# Patient Record
Sex: Female | Born: 1935 | Race: White | Hispanic: No | Smoking: Never smoker
Health system: Southern US, Community
[De-identification: ages and names within clinical notes are randomized; demographics above are authoritative.]

## PROBLEM LIST (undated history)

## (undated) DIAGNOSIS — R5383 Other fatigue: Secondary | ICD-10-CM

## (undated) DIAGNOSIS — F419 Anxiety disorder, unspecified: Secondary | ICD-10-CM

## (undated) DIAGNOSIS — M8580 Other specified disorders of bone density and structure, unspecified site: Secondary | ICD-10-CM

## (undated) DIAGNOSIS — I10 Essential (primary) hypertension: Secondary | ICD-10-CM

## (undated) DIAGNOSIS — H269 Unspecified cataract: Secondary | ICD-10-CM

## (undated) DIAGNOSIS — K625 Hemorrhage of anus and rectum: Secondary | ICD-10-CM

## (undated) DIAGNOSIS — I639 Cerebral infarction, unspecified: Secondary | ICD-10-CM

## (undated) DIAGNOSIS — H3320 Serous retinal detachment, unspecified eye: Secondary | ICD-10-CM

## (undated) DIAGNOSIS — T2200XA Burn of unspecified degree of shoulder and upper limb, except wrist and hand, unspecified site, initial encounter: Secondary | ICD-10-CM

## (undated) DIAGNOSIS — F0390 Unspecified dementia without behavioral disturbance: Secondary | ICD-10-CM

## (undated) HISTORY — DX: Burn of unspecified degree of shoulder and upper limb, except wrist and hand, unspecified site, initial encounter: T22.00XA

## (undated) HISTORY — DX: Anxiety disorder, unspecified: F41.9

## (undated) HISTORY — DX: Serous retinal detachment, unspecified eye: H33.20

## (undated) HISTORY — PX: ABDOMINAL HYSTERECTOMY: SHX81

## (undated) HISTORY — PX: OOPHORECTOMY: SHX86

## (undated) HISTORY — DX: Unspecified dementia without behavioral disturbance: F03.90

## (undated) HISTORY — DX: Unspecified cataract: H26.9

## (undated) HISTORY — DX: Essential (primary) hypertension: I10

## (undated) HISTORY — DX: Other specified disorders of bone density and structure, unspecified site: M85.80

## (undated) HISTORY — PX: CHOLECYSTECTOMY: SHX55

---

## 2001-04-07 ENCOUNTER — Encounter (INDEPENDENT_AMBULATORY_CARE_PROVIDER_SITE_OTHER): Payer: Self-pay

## 2001-04-07 ENCOUNTER — Observation Stay (HOSPITAL_COMMUNITY): Admission: RE | Admit: 2001-04-07 | Discharge: 2001-04-08 | Payer: Self-pay | Admitting: General Surgery

## 2003-03-19 ENCOUNTER — Encounter: Payer: Self-pay | Admitting: Ophthalmology

## 2003-03-19 ENCOUNTER — Ambulatory Visit (HOSPITAL_COMMUNITY): Admission: RE | Admit: 2003-03-19 | Discharge: 2003-03-20 | Payer: Self-pay | Admitting: Ophthalmology

## 2004-02-14 ENCOUNTER — Ambulatory Visit (HOSPITAL_COMMUNITY): Admission: RE | Admit: 2004-02-14 | Discharge: 2004-02-14 | Payer: Self-pay | Admitting: Ophthalmology

## 2004-11-07 ENCOUNTER — Ambulatory Visit: Payer: Self-pay | Admitting: Internal Medicine

## 2004-11-13 ENCOUNTER — Ambulatory Visit: Payer: Self-pay | Admitting: Internal Medicine

## 2005-05-08 ENCOUNTER — Ambulatory Visit: Payer: Self-pay | Admitting: Internal Medicine

## 2005-08-10 ENCOUNTER — Ambulatory Visit: Payer: Self-pay | Admitting: Internal Medicine

## 2005-11-06 ENCOUNTER — Ambulatory Visit: Payer: Self-pay | Admitting: Internal Medicine

## 2006-05-08 ENCOUNTER — Ambulatory Visit: Payer: Self-pay | Admitting: Internal Medicine

## 2006-06-06 ENCOUNTER — Ambulatory Visit: Payer: Self-pay | Admitting: Internal Medicine

## 2006-06-24 ENCOUNTER — Ambulatory Visit: Payer: Self-pay | Admitting: Internal Medicine

## 2006-08-08 ENCOUNTER — Ambulatory Visit: Payer: Self-pay | Admitting: Internal Medicine

## 2006-10-21 ENCOUNTER — Ambulatory Visit: Payer: Self-pay | Admitting: Internal Medicine

## 2006-10-21 LAB — CONVERTED CEMR LAB
ALT: 14 units/L (ref 0–40)
BUN: 13 mg/dL (ref 6–23)
Chol/HDL Ratio, serum: 3.2
Cholesterol: 179 mg/dL (ref 0–200)
Creatinine, Ser: 0.9 mg/dL (ref 0.4–1.2)
Hgb A1c MFr Bld: 5.6 % (ref 4.6–6.0)
Potassium: 4.4 meq/L (ref 3.5–5.1)

## 2006-10-22 DIAGNOSIS — I10 Essential (primary) hypertension: Secondary | ICD-10-CM

## 2006-10-22 DIAGNOSIS — F411 Generalized anxiety disorder: Secondary | ICD-10-CM

## 2006-10-22 DIAGNOSIS — H3320 Serous retinal detachment, unspecified eye: Secondary | ICD-10-CM | POA: Insufficient documentation

## 2007-02-19 ENCOUNTER — Ambulatory Visit: Payer: Self-pay | Admitting: Internal Medicine

## 2007-07-21 ENCOUNTER — Ambulatory Visit: Payer: Self-pay | Admitting: Internal Medicine

## 2007-07-21 DIAGNOSIS — M899 Disorder of bone, unspecified: Secondary | ICD-10-CM | POA: Insufficient documentation

## 2007-07-21 DIAGNOSIS — M949 Disorder of cartilage, unspecified: Secondary | ICD-10-CM

## 2007-07-22 LAB — CONVERTED CEMR LAB
GFR calc Af Amer: 89 mL/min
GFR calc non Af Amer: 74 mL/min
Glucose, Bld: 98 mg/dL (ref 70–99)
Potassium: 3.7 meq/L (ref 3.5–5.1)
Sodium: 142 meq/L (ref 135–145)

## 2007-08-14 ENCOUNTER — Encounter: Payer: Self-pay | Admitting: Internal Medicine

## 2007-09-10 ENCOUNTER — Encounter (INDEPENDENT_AMBULATORY_CARE_PROVIDER_SITE_OTHER): Payer: Self-pay | Admitting: *Deleted

## 2007-10-21 ENCOUNTER — Encounter: Payer: Self-pay | Admitting: Internal Medicine

## 2007-11-24 ENCOUNTER — Ambulatory Visit: Payer: Self-pay | Admitting: Internal Medicine

## 2007-11-28 LAB — CONVERTED CEMR LAB
Cholesterol: 168 mg/dL (ref 0–200)
Eosinophils Absolute: 0.3 10*3/uL (ref 0.0–0.6)
Eosinophils Relative: 4.7 % (ref 0.0–5.0)
HCT: 42 % (ref 36.0–46.0)
Hemoglobin: 13.7 g/dL (ref 12.0–15.0)
Lymphocytes Relative: 21.7 % (ref 12.0–46.0)
MCHC: 32.6 g/dL (ref 30.0–36.0)
MCV: 88.1 fL (ref 78.0–100.0)
Monocytes Absolute: 0.5 10*3/uL (ref 0.2–0.7)
Neutro Abs: 3.5 10*3/uL (ref 1.4–7.7)
Neutrophils Relative %: 64.6 % (ref 43.0–77.0)
RBC: 4.77 M/uL (ref 3.87–5.11)
WBC: 5.5 10*3/uL (ref 4.5–10.5)

## 2007-12-08 ENCOUNTER — Telehealth (INDEPENDENT_AMBULATORY_CARE_PROVIDER_SITE_OTHER): Payer: Self-pay | Admitting: *Deleted

## 2008-01-14 HISTORY — PX: CATARACT EXTRACTION: SUR2

## 2008-05-19 ENCOUNTER — Ambulatory Visit: Payer: Self-pay | Admitting: Internal Medicine

## 2008-05-20 ENCOUNTER — Encounter (INDEPENDENT_AMBULATORY_CARE_PROVIDER_SITE_OTHER): Payer: Self-pay | Admitting: *Deleted

## 2008-05-20 LAB — CONVERTED CEMR LAB
BUN: 15 mg/dL (ref 6–23)
Creatinine, Ser: 1 mg/dL (ref 0.4–1.2)
GFR calc Af Amer: 69 mL/min
Glucose, Bld: 107 mg/dL — ABNORMAL HIGH (ref 70–99)
Potassium: 4.7 meq/L (ref 3.5–5.1)

## 2008-08-16 ENCOUNTER — Encounter: Payer: Self-pay | Admitting: Internal Medicine

## 2008-08-23 ENCOUNTER — Telehealth (INDEPENDENT_AMBULATORY_CARE_PROVIDER_SITE_OTHER): Payer: Self-pay | Admitting: *Deleted

## 2008-11-26 ENCOUNTER — Ambulatory Visit: Payer: Self-pay | Admitting: Internal Medicine

## 2009-03-18 ENCOUNTER — Ambulatory Visit: Payer: Self-pay | Admitting: Internal Medicine

## 2009-03-23 ENCOUNTER — Ambulatory Visit: Payer: Self-pay | Admitting: Internal Medicine

## 2009-04-05 ENCOUNTER — Telehealth: Payer: Self-pay | Admitting: Internal Medicine

## 2009-05-31 ENCOUNTER — Ambulatory Visit: Payer: Self-pay | Admitting: Internal Medicine

## 2009-06-03 ENCOUNTER — Encounter (INDEPENDENT_AMBULATORY_CARE_PROVIDER_SITE_OTHER): Payer: Self-pay | Admitting: *Deleted

## 2009-06-03 LAB — CONVERTED CEMR LAB
BUN: 20 mg/dL (ref 6–23)
CO2: 31 meq/L (ref 19–32)
Calcium: 9.5 mg/dL (ref 8.4–10.5)
Chloride: 105 meq/L (ref 96–112)
Cholesterol: 158 mg/dL (ref 0–200)
Creatinine, Ser: 0.9 mg/dL (ref 0.4–1.2)
Eosinophils Absolute: 0.4 10*3/uL (ref 0.0–0.7)
MCHC: 34.8 g/dL (ref 30.0–36.0)
MCV: 87.8 fL (ref 78.0–100.0)
Monocytes Absolute: 0.6 10*3/uL (ref 0.1–1.0)
Neutrophils Relative %: 60.4 % (ref 43.0–77.0)
Platelets: 236 10*3/uL (ref 150.0–400.0)
Triglycerides: 69 mg/dL (ref 0.0–149.0)
WBC: 6.2 10*3/uL (ref 4.5–10.5)

## 2009-08-18 ENCOUNTER — Encounter: Payer: Self-pay | Admitting: Internal Medicine

## 2009-09-27 ENCOUNTER — Ambulatory Visit: Payer: Self-pay | Admitting: Internal Medicine

## 2009-09-30 LAB — CONVERTED CEMR LAB: AST: 27 units/L (ref 0–37)

## 2010-03-31 ENCOUNTER — Telehealth: Payer: Self-pay | Admitting: Internal Medicine

## 2010-03-31 ENCOUNTER — Ambulatory Visit: Payer: Self-pay | Admitting: Internal Medicine

## 2010-04-03 LAB — CONVERTED CEMR LAB
CO2: 30 meq/L (ref 19–32)
Chloride: 106 meq/L (ref 96–112)
Potassium: 5.5 meq/L — ABNORMAL HIGH (ref 3.5–5.1)
Sodium: 142 meq/L (ref 135–145)

## 2010-04-14 ENCOUNTER — Ambulatory Visit: Payer: Self-pay | Admitting: Internal Medicine

## 2010-07-27 ENCOUNTER — Ambulatory Visit: Payer: Self-pay | Admitting: Internal Medicine

## 2010-08-21 ENCOUNTER — Encounter: Payer: Self-pay | Admitting: Internal Medicine

## 2010-09-14 ENCOUNTER — Ambulatory Visit: Payer: Self-pay | Admitting: Internal Medicine

## 2010-09-14 DIAGNOSIS — T22039A Burn of unspecified degree of unspecified upper arm, initial encounter: Secondary | ICD-10-CM | POA: Insufficient documentation

## 2010-09-15 ENCOUNTER — Encounter (HOSPITAL_BASED_OUTPATIENT_CLINIC_OR_DEPARTMENT_OTHER)
Admission: RE | Admit: 2010-09-15 | Discharge: 2010-11-14 | Payer: Self-pay | Source: Home / Self Care | Attending: General Surgery | Admitting: General Surgery

## 2010-09-20 ENCOUNTER — Telehealth: Payer: Self-pay | Admitting: Internal Medicine

## 2010-09-20 ENCOUNTER — Encounter: Payer: Self-pay | Admitting: Internal Medicine

## 2010-09-28 ENCOUNTER — Telehealth: Payer: Self-pay | Admitting: Internal Medicine

## 2010-09-28 ENCOUNTER — Encounter: Payer: Self-pay | Admitting: Internal Medicine

## 2010-09-28 ENCOUNTER — Ambulatory Visit: Payer: Self-pay | Admitting: Internal Medicine

## 2010-09-28 DIAGNOSIS — R634 Abnormal weight loss: Secondary | ICD-10-CM

## 2010-09-28 LAB — CONVERTED CEMR LAB: Vit D, 25-Hydroxy: 44 ng/mL (ref 30–89)

## 2010-09-29 LAB — CONVERTED CEMR LAB
AST: 28 units/L (ref 0–37)
Basophils Relative: 0.3 % (ref 0.0–3.0)
CO2: 30 meq/L (ref 19–32)
Calcium: 9.8 mg/dL (ref 8.4–10.5)
Eosinophils Absolute: 0.1 10*3/uL (ref 0.0–0.7)
Eosinophils Relative: 0.6 % (ref 0.0–5.0)
Hemoglobin: 14.5 g/dL (ref 12.0–15.0)
Lymphocytes Relative: 11.5 % — ABNORMAL LOW (ref 12.0–46.0)
MCHC: 34.3 g/dL (ref 30.0–36.0)
Monocytes Relative: 7.9 % (ref 3.0–12.0)
Neutro Abs: 8.1 10*3/uL — ABNORMAL HIGH (ref 1.4–7.7)
Neutrophils Relative %: 79.7 % — ABNORMAL HIGH (ref 43.0–77.0)
RBC: 4.8 M/uL (ref 3.87–5.11)
Sodium: 139 meq/L (ref 135–145)
TSH: 2.66 microintl units/mL (ref 0.35–5.50)
WBC: 10.2 10*3/uL (ref 4.5–10.5)

## 2010-10-02 ENCOUNTER — Encounter
Admission: RE | Admit: 2010-10-02 | Discharge: 2010-10-02 | Payer: Self-pay | Source: Home / Self Care | Attending: Internal Medicine | Admitting: Internal Medicine

## 2010-10-03 ENCOUNTER — Ambulatory Visit (HOSPITAL_COMMUNITY)
Admission: RE | Admit: 2010-10-03 | Discharge: 2010-10-03 | Payer: Self-pay | Source: Home / Self Care | Attending: Obstetrics and Gynecology | Admitting: Obstetrics and Gynecology

## 2010-10-06 ENCOUNTER — Ambulatory Visit (HOSPITAL_COMMUNITY)
Admission: RE | Admit: 2010-10-06 | Discharge: 2010-10-06 | Payer: Self-pay | Source: Home / Self Care | Attending: Obstetrics and Gynecology | Admitting: Obstetrics and Gynecology

## 2010-10-10 ENCOUNTER — Encounter: Payer: Self-pay | Admitting: Internal Medicine

## 2010-10-10 ENCOUNTER — Telehealth: Payer: Self-pay | Admitting: Internal Medicine

## 2010-10-13 ENCOUNTER — Telehealth: Payer: Self-pay | Admitting: Internal Medicine

## 2010-10-18 ENCOUNTER — Other Ambulatory Visit: Payer: Self-pay | Admitting: Internal Medicine

## 2010-10-18 ENCOUNTER — Ambulatory Visit
Admission: RE | Admit: 2010-10-18 | Discharge: 2010-10-18 | Payer: Self-pay | Source: Home / Self Care | Attending: Internal Medicine | Admitting: Internal Medicine

## 2010-10-18 LAB — POTASSIUM: Potassium: 4.2 mEq/L (ref 3.5–5.1)

## 2010-10-19 ENCOUNTER — Ambulatory Visit: Admit: 2010-10-19 | Payer: Self-pay | Admitting: Internal Medicine

## 2010-10-30 ENCOUNTER — Telehealth: Payer: Self-pay | Admitting: Internal Medicine

## 2010-11-05 ENCOUNTER — Encounter: Payer: Self-pay | Admitting: Obstetrics and Gynecology

## 2010-11-14 NOTE — Assessment & Plan Note (Signed)
Summary: 6 MTH FU/NS/KDC   Vital Signs:  Patient profile:   77 year old female Height:      62 inches Weight:      110 pounds Temp:     97.7 degrees F oral Pulse rate:   68 / minute BP sitting:   100 / 78  (left arm)  Vitals Entered By: Jeremy Johann CMA (March 31, 2010 9:33 AM) CC: 6 month f/u Comments --Fasting REVIEWED MED LIST, PATIENT AGREED DOSE AND INSTRUCTION CORRECT    History of Present Illness: She feels well, she has been very active in the yard and drinking plenty of fluids  Allergies: 1)  * Sulfa (Sulfonamides) Group 2)  * Sulfa (Sulfonamides) Group  Past History:  Past Medical History: Reviewed history from 09/27/2009 and no changes required. Anxiety Hypertension Osteopenia retinal detachment has cataracts  Past Surgical History: Reviewed history from 05/19/2008 and no changes required. Cholecystectomy Hysterectomy Oophorectomy cataract  L eye 4-09  Social History: Reviewed history from 09/27/2009 and no changes required. Married 2 sons 4 G-sons Tobacco-- no ETOH-- rarely  very active , no routine exercise   Review of Systems       she has a history of anxiety, slightly better, alprazolam does help. She is concerned about the health of her husband who needs a lung biopsy No ambulatory BPs Denies chest pain or shortness of breath No nausea, vomiting, diarrhea  Physical Exam  General:  alert, well-developed, and well-nourished.   Lungs:  normal respiratory effort, no intercostal retractions, no accessory muscle use, and normal breath sounds.   Heart:  normal rate, regular rhythm, and no murmur.   Extremities:  no edema Psych:  Oriented X3, memory intact for recent and remote, normally interactive, good eye contact, not anxious appearing, and not depressed appearing.     Impression & Recommendations:  Problem # 1:  HYPERTENSION (ICD-401.9)  BP slightly low today, patient asymptomatic, no change Her updated medication list for this  problem includes:    Spironolactone 25 Mg Tabs (Spironolactone) .Marland Kitchen... Take 1 tablet by mouth once a day    Metoprolol Tartrate 50 Mg Tabs (Metoprolol tartrate) .Marland Kitchen... Take 1and a half  tablet twice daily  BP today: 100/78 Prior BP: 110/80 (09/27/2009)  Labs Reviewed: K+: 4.9 (05/31/2009) Creat: : 0.9 (05/31/2009)   Chol: 158 (05/31/2009)   HDL: 57.50 (05/31/2009)   LDL: 87 (05/31/2009)   TG: 69.0 (05/31/2009)  Orders: Venipuncture (66440) TLB-BMP (Basic Metabolic Panel-BMET) (80048-METABOL)  Problem # 2:  ANXIETY (ICD-300.00) seems well controlled at this point. We'll refill her alprazolam with needed Her updated medication list for this problem includes:    Alprazolam 0.5 Mg Tabs (Alprazolam) .Marland Kitchen... 1/2-1 by mouth tid  Complete Medication List: 1)  Spironolactone 25 Mg Tabs (Spironolactone) .... Take 1 tablet by mouth once a day 2)  Cenestin 0.3 Mg Tabs (Estrogens conj synthetic a) .Marland Kitchen.. 1 by mouth qd 3)  Metoprolol Tartrate 50 Mg Tabs (Metoprolol tartrate) .... Take 1and a half  tablet twice daily 4)  Alendronate Sodium 70 Mg Tabs (Alendronate sodium) .... Per gyn 5)  Alprazolam 0.5 Mg Tabs (Alprazolam) .... 1/2-1 by mouth tid  Patient Instructions: 1)  Please schedule a follow-up appointment in 6 months (yearly , fasting) Prescriptions: SPIRONOLACTONE 25 MG TABS (SPIRONOLACTONE) Take 1 tablet by mouth once a day  #90 x 3   Entered and Authorized by:   Elita Quick E. Eliazar Olivar MD   Signed by:   Nolon Rod. Jordana Dugue MD on 03/31/2010  Method used:   Reprint   RxID:   [id number] SPIRONOLACTONE 25 MG TABS (SPIRONOLACTONE) Take 1 tablet by mouth once a day  #90 x 3   Entered and Authorized by:   Elita Quick E. Netty Sullivant MD   Signed by:   Nolon Rod. Cheney Ewart MD on 03/31/2010   Method used:   Electronically to        [facility]* (retail)       [address]       [place], [place]  [address]       Ph: [phone]       Fax: [phone]   RxID:   [id number]

## 2010-11-14 NOTE — Progress Notes (Signed)
Summary: high potassium  Phone Note Outgoing Call   Call placed by: Jeremy Johann CMA,  March 31, 2010 5:08 PM Details for Reason: advised patient, avoid foods high in potassium   Recheck in 2 weeks Consider change his diuretic, she is on Spironolactone Summary of Call: tried to call pt line busy will try again on monday.........................Marland KitchenFelecia Deloach CMA  March 31, 2010 5:08 PM   Follow-up for Phone Call        spoke with the patient, discussed a low potassium diet. Will mail a list of potassium rich foods. She will also come back in 2 weeks for a BMP Follow-up by: Tonika Eden E. Amadeus Oyama MD,  March 31, 2010 5:09 PM

## 2010-11-14 NOTE — Progress Notes (Signed)
Summary: pt status   ---- Converted from flag ---- ---- 09/15/2010 4:58 PM, Jose E. Paz MD wrote: please check on her ------------------------------  I spoke w/ pt and she states that her arm is still really bad. Pt states she is going back for the next 4 weeks. Army Fossa CMA  September 20, 2010 4:02 PM If wound looks worse, needs to discuss with the wound care center [facility] E. Paz MD  September 21, 2010 12:53 PM   She is seeing them once a week for the next 4 weeks. Army Fossa CMA  September 21, 2010 1:03 PM
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
## 2010-11-14 NOTE — Assessment & Plan Note (Signed)
Summary: FLU SHOT/KB  Nurse Visit  CC: Flu shot./kb   Allergies: 1)  * Sulfa (Sulfonamides) Group 2)  * Sulfa (Sulfonamides) Group  Orders Added: 1)  Flu Vaccine 87yrs + MEDICARE PATIENTS [Q2039] 2)  Administration Flu vaccine - MCR [G0008]           Flu Vaccine Consent Questions     Do you have a history of severe allergic reactions to this vaccine? no    Any prior history of allergic reactions to egg and/or gelatin? no    Do you have a sensitivity to the preservative Thimersol? no    Do you have a past history of Guillan-Barre Syndrome? no    Do you currently have an acute febrile illness? no    Have you ever had a severe reaction to latex? no    Vaccine information given and explained to patient? yes    Are you currently pregnant? no    Lot Number:[id number]   Exp Date:04/14/2011   Site Given  Left Deltoid IMu

## 2010-11-15 ENCOUNTER — Encounter: Payer: Self-pay | Admitting: Internal Medicine

## 2010-11-15 ENCOUNTER — Ambulatory Visit: Admit: 2010-11-15 | Payer: Self-pay | Admitting: Internal Medicine

## 2010-11-15 ENCOUNTER — Ambulatory Visit (INDEPENDENT_AMBULATORY_CARE_PROVIDER_SITE_OTHER): Payer: Medicare Other | Admitting: Internal Medicine

## 2010-11-15 DIAGNOSIS — F411 Generalized anxiety disorder: Secondary | ICD-10-CM

## 2010-11-15 DIAGNOSIS — I10 Essential (primary) hypertension: Secondary | ICD-10-CM

## 2010-11-16 NOTE — Progress Notes (Signed)
Summary: Increased BP  Phone Note Call from Patient   Summary of Call: Patient called the office about her BP being over 140 (147, 149, and as high as 170). Patient was advised to take her Fluoxetine 1 once daily in addition to Metoprolol two times a day and keep diary of readings. Patient was advised to call the office if BP is high and she starts experiencing symptoms. Initial call taken by: Lucious Groves CMA,  October 30, 2010 9:55 AM  Follow-up for Phone Call        Clarification:  Patient was on 1/2 of Fluoxetine. She is just starting the whole tablet today. Lucious Groves CMA  October 30, 2010 3:22 PM   Additional Follow-up for Phone Call Additional follow up Details #1::        add losartan 25mg  1 by mouth once daily nurse visit 2 weeks: BP check and BMP Yanitza Shvartsman E. Justina Bertini MD  October 30, 2010 4:59 PM    Patient notifed and already has appt for 11/15/10. She will keep that appt. Lucious Groves CMA  October 31, 2010 11:05 AM     Additional Follow-up for Phone Call Additional follow up Details #2::    left message on machine to call back to office. Lucious Groves CMA  October 31, 2010 9:49 AM   New/Updated Medications: LOSARTAN POTASSIUM 25 MG TABS (LOSARTAN POTASSIUM) 1 by mouth once daily Prescriptions: LOSARTAN POTASSIUM 25 MG TABS (LOSARTAN POTASSIUM) 1 by mouth once daily  #30 x 1   Entered by:   Lucious Groves CMA   Authorized by:   Nolon Rod. Kace Hartje MD   Signed by:   Lucious Groves CMA on 10/31/2010   Method used:   Electronically to        [id number] (retail)       [address]       [place], [place]  [address]       Ph: [phone] or [phone]       Fax: [phone]   RxID:   [id number]

## 2010-11-16 NOTE — Progress Notes (Signed)
Summary: Elevated BP   Phone Note Call from Patient   Caller: Patient Summary of Call: Pt c/o elevated BP of 181/72 today.  Pt states that BP has been running high since yesterday. Pt does not have reading just states that it was 190 something. Pt denies any headache,nausea, sob, chest pain, dizziness, numbness just elevation in BP. Pls advise................Marland KitchenFelecia Deloach CMA  October 13, 2010 11:13 AM   Follow-up for Phone Call        recommend to check her BP once or twice a day. Patient to call if the blood pressure is consistently more than 145/85. a single elevated blood pressure reading usually does not require medication changes. If she has headache, chest pain, other symptoms then she needs to contact us as well Follow-up by: Macayla Ekdahl E. Wilkes Potvin MD,  October 13, 2010 12:13 PM  Additional Follow-up for Phone Call Additional follow up Details #1::        Discuss with patient .............Marland KitchenFelecia Deloach CMA  October 13, 2010 1:21 PM

## 2010-11-16 NOTE — Progress Notes (Signed)
Summary: xanax refill  Phone Note Refill Request Message from:  Patient on September 28, 2010 10:07 AM  Refills Requested: Medication #1:  ALPRAZOLAM 0.5 MG  TABS 1/2-1 by mouth tid [facility] [address] [facility]    Method Requested: Fax to Local Pharmacy Initial call taken by: Army Fossa CMA,  September 28, 2010 10:07 AM  Follow-up for Phone Call        90, 3 RF Aubri Gathright E. Kylen Ismael MD  September 28, 2010 10:21 AM     Prescriptions: ALPRAZOLAM 0.5 MG  TABS (ALPRAZOLAM) 1/2-1 by mouth tid  #90 x 3   Entered by:   Army Fossa CMA   Authorized by:   Nolon Rod. Anterrio Mccleery MD   Signed by:   Army Fossa CMA on 09/28/2010   Method used:   Printed then faxed to ...       [id number] (retail)       [address]       [place], [place]  [address]       Ph: [phone] or [phone]       Fax: [phone]   RxID:   [id number]

## 2010-11-16 NOTE — Consult Note (Signed)
Summary: [facility]  [facility]   Imported By: Lanelle Bal 10/24/2010 08:33:08  _____________________________________________________________________  External Attachment:    Type:   Image     Comment:   External Document

## 2010-11-16 NOTE — Progress Notes (Signed)
Summary: Test results  Phone Note Call from Patient Call back at Home Phone [phone]   Caller: Patient Summary of Call: patient called to check on her "scan" results from last week--please call her at [phone] Initial call taken by: Jerolyn Shin,  October 10, 2010 10:39 AM  Follow-up for Phone Call        Patient was Cheryl Armstrong notified that her u/s was good. She did note remember speaking with Danielle. Follow-up by: Lucious Groves CMA,  October 10, 2010 11:17 AM

## 2010-11-16 NOTE — Consult Note (Signed)
Summary: [facility]   [facility]   Imported By: Lanelle Bal 10/05/2010 09:43:47  _____________________________________________________________________  External Attachment:    Type:   Image     Comment:   External Document
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
## 2010-11-16 NOTE — Assessment & Plan Note (Signed)
Summary: BLD PRESSURE RUNNING TOO HIGH/RH.....   Vital Signs:  Patient profile:   77 year old female Weight:      106.38 pounds Pulse rate:   80 / minute Pulse rhythm:   regular BP sitting:   158 / 78  (left arm) Cuff size:   regular  Vitals Entered By: Army Fossa CMA (October 18, 2010 11:26 AM) CC: BP has been elevated- not fasting  Comments running around 160/76 [facility] [address]    History of Present Illness: her blood pressure has been slightly elevated lately. We recently discontinued her spironolactone due to a ncreased potassium. In addition, she lost her husband in November 2011, having a hard time coping with that. having to do a lot of paperwork, etc  Very anxious throughout the day. Xanax helps but not completely  Current Medications (verified): 1)  Metoprolol Tartrate 50 Mg  Tabs (Metoprolol Tartrate) .... Take 1and A Half  Tablet Twice Daily 2)  Alprazolam 0.5 Mg  Tabs (Alprazolam) .... 1/2-1 By Mouth Tid 3)  Lidocaine Hcl 2 % Gel (Lidocaine Hcl)  Allergies (verified): 1)  * Sulfa (Sulfonamides) Group 2)  * Sulfa (Sulfonamides) Group  Social History: Married, lost husband 08-2010 lives by herself  2 sons, in [place], live close to her house 4 G-sons Tobacco-- no ETOH-- rarely  very active , no routine exercise   Review of Systems       besides anxiety, she is also tearful and somehow depressed. Denies suicide ideas Sleeps relatively well, approximately 8 hours at night  Physical Exam  General:  alert, well-developed, and well-nourished.   Psych:  Oriented X3, memory intact for recent and remote, normally interactive, good eye contact, not anxious appearing, slt  depressed ?    Impression & Recommendations:  Problem # 1:  HYPERTENSION (ICD-401.9) recently,  spironolactone  was discontinued due to elevated potassium. BP is slightly higher than usual Plan: Recheck potassium Increase metoprolol from 150 mg twice a day to 200  milligrams twice a  day Her updated medication list for this problem includes:    Metoprolol Tartrate 50 Mg Tabs (Metoprolol tartrate) .Marland Kitchen... 2  tablets  twice daily  BP today: 158/78 Prior BP: 138/82 (09/28/2010)  Labs Reviewed: K+: 5.9 (09/28/2010) Creat: : 0.9 (09/28/2010)   Chol: 158 (05/31/2009)   HDL: 57.50 (05/31/2009)   LDL: 87 (05/31/2009)   TG: 69.0 (05/31/2009)  Orders: Venipuncture (69629) TLB-Potassium (K+) (84132-K) Specimen Handling (52841)  Problem # 2:  ANXIETY (ICD-300.00) see history of present illness Needs something in addition to Xanax to help her cope with her husband loss. has never try an SSRI. Trial with Prozac. Her updated medication list for this problem includes:    Alprazolam 0.5 Mg Tabs (Alprazolam) .Marland Kitchen... 1/2-1 by mouth tid    Fluoxetine Hcl 20 Mg Tabs (Fluoxetine hcl) ..... Half tablet daily for 10 days, then take one tablet daily  Complete Medication List: 1)  Metoprolol Tartrate 50 Mg Tabs (Metoprolol tartrate) .... 2  tablets  twice daily 2)  Alprazolam 0.5 Mg Tabs (Alprazolam) .... 1/2-1 by mouth tid 3)  Lidocaine Hcl 2 % Gel (Lidocaine hcl) 4)  Fluoxetine Hcl 20 Mg Tabs (Fluoxetine hcl) .... Half tablet daily for 10 days, then take one tablet daily  Patient Instructions: 1)  start fluoxetine as prescribed. Watch for side effects. 2)  Increase metoprolol to 2 tablets twice a day 3)  Come back in 4 weeks for a checkup Prescriptions: FLUOXETINE HCL 20 MG TABS (FLUOXETINE  HCL) half tablet daily for 10 days, then take one tablet daily  #30 x 1   Entered and Authorized by:   Elita Quick E. Paz MD   Signed by:   Nolon Rod. Paz MD on 10/18/2010   Method used:   Print then Give to Patient   RxID:   [id number]    Orders Added: 1)  Venipuncture [02725] 2)  TLB-Potassium (K+) [84132-K] 3)  Specimen Handling [99000] 4)  Est. Patient Level III [36644]

## 2010-11-17 ENCOUNTER — Encounter (HOSPITAL_BASED_OUTPATIENT_CLINIC_OR_DEPARTMENT_OTHER): Payer: Medicare Other | Attending: General Surgery

## 2010-11-17 DIAGNOSIS — X020XXA Exposure to flames in controlled fire in building or structure, initial encounter: Secondary | ICD-10-CM | POA: Insufficient documentation

## 2010-11-17 DIAGNOSIS — Y93G9 Activity, other involving cooking and grilling: Secondary | ICD-10-CM | POA: Insufficient documentation

## 2010-11-17 DIAGNOSIS — T22219A Burn of second degree of unspecified forearm, initial encounter: Secondary | ICD-10-CM | POA: Insufficient documentation

## 2010-11-17 DIAGNOSIS — Y92009 Unspecified place in unspecified non-institutional (private) residence as the place of occurrence of the external cause: Secondary | ICD-10-CM | POA: Insufficient documentation

## 2010-11-17 DIAGNOSIS — Y998 Other external cause status: Secondary | ICD-10-CM | POA: Insufficient documentation

## 2010-11-22 NOTE — Assessment & Plan Note (Signed)
Summary: 4 week f/u   Vital Signs:  Patient profile:   77 year old female Weight:      102.25 pounds Pulse rate:   70 / minute Pulse rhythm:   regular BP sitting:   124 / 76  (left arm) Cuff size:   regular  Vitals Entered By: Army Fossa CMA (November 15, 2010 8:39 AM) CC: 4 week f/u- not fasting  Comments she states she is doing a little better [facility] [address]     History of Present Illness: f/u from last visit she was prescribed fluoxetine, tolerating it well;  overall feels better  Current Medications (verified): 1)  Metoprolol Tartrate 50 Mg  Tabs (Metoprolol Tartrate) .... 2  Tablets  Twice Daily 2)  Alprazolam 0.5 Mg  Tabs (Alprazolam) .... 1/2-1 By Mouth Tid 3)  Fluoxetine Hcl 20 Mg Tabs (Fluoxetine Hcl) .... Half Tablet Daily For 10 Days, Then Take One Tablet Daily 4)  Losartan Potassium 25 Mg Tabs (Losartan Potassium) .Marland Kitchen.. 1 By Mouth Once Daily 5)  Doxycycline Hyclate 100 Mg Tabs (Doxycycline Hyclate) .Marland Kitchen.. 1 By Mouth Two Times A Day- Per Wound Clinic  Allergies (verified): 1)  * Sulfa (Sulfonamides) Group 2)  * Sulfa (Sulfonamides) Group  Past History:  Past Medical History: Reviewed history from 09/28/2010 and no changes required. Anxiety Hypertension Osteopenia retinal detachment has cataracts  Past Surgical History: Reviewed history from 05/19/2008 and no changes required. Cholecystectomy Hysterectomy Oophorectomy cataract  L eye 4-09  Social History: Reviewed history from 10/18/2010 and no changes required. Married, lost husband 08-2010 lives by herself  2 sons, in [place], live close to her house 4 G-sons Tobacco-- no ETOH-- rarely  very active , no routine exercise   Review of Systems       no nausea, vomiting, diarrhea Sleeps better less  anxious   Physical Exam  General:  alert, well-developed, and well-nourished.   Psych:  Oriented X3, memory intact for recent and remote, normally interactive, good eye contact, not anxious  appearing, and not depressed appearing.     Impression & Recommendations:  Problem # 1:  ANXIETY (ICD-300.00) started fluoxetine 10-18-09 4 increase anxiety Tolerating well Good response I continue counseling her today Her updated medication list for this problem includes:    Alprazolam 0.5 Mg Tabs (Alprazolam) .Marland Kitchen... 1/2-1 by mouth tid    Fluoxetine Hcl 20 Mg Tabs (Fluoxetine hcl) ..... One tablet daily     Problem # 2:  .face-to-face more than 15 minutes, more than 50% of the time counseling  Problem # 3:  HYPERTENSION (ICD-401.9) needs refills. BP well controlled Her updated medication list for this problem includes:    Metoprolol Tartrate 50 Mg Tabs (Metoprolol tartrate) .Marland Kitchen... 2  tablets  twice daily    Losartan Potassium 25 Mg Tabs (Losartan potassium) .Marland Kitchen... 1 by mouth once daily  BP today: 124/76 Prior BP: 158/78 (10/18/2010)  Labs Reviewed: K+: 4.2 (10/18/2010) Creat: : 0.9 (09/28/2010)   Chol: 158 (05/31/2009)   HDL: 57.50 (05/31/2009)   LDL: 87 (05/31/2009)   TG: 69.0 (05/31/2009)  Complete Medication List: 1)  Metoprolol Tartrate 50 Mg Tabs (Metoprolol tartrate) .... 2  tablets  twice daily 2)  Alprazolam 0.5 Mg Tabs (Alprazolam) .... 1/2-1 by mouth tid 3)  Fluoxetine Hcl 20 Mg Tabs (Fluoxetine hcl) .... One tablet daily 4)  Losartan Potassium 25 Mg Tabs (Losartan potassium) .Marland Kitchen.. 1 by mouth once daily 5)  Doxycycline Hyclate 100 Mg Tabs (Doxycycline hyclate) .Marland Kitchen.. 1 by mouth two  times a day- per wound clinic  Patient Instructions: 1)  Please schedule a follow-up appointment in 4 months .  Prescriptions: LOSARTAN POTASSIUM 25 MG TABS (LOSARTAN POTASSIUM) 1 by mouth once daily  #90 x 3   Entered and Authorized by:   Elita Quick E. Raylee Strehl MD   Signed by:   Nolon Rod. Jayson Waterhouse MD on 11/15/2010   Method used:   Print then Give to Patient   RxID:   [id number] FLUOXETINE HCL 20 MG TABS (FLUOXETINE HCL) one tablet daily  #90 x 3   Entered and Authorized by:   Nolon Rod. Tc Kapusta MD   Signed  by:   Nolon Rod. Abem Shaddix MD on 11/15/2010   Method used:   Print then Give to Patient   RxID:   [id number]    Orders Added: 1)  Est. Patient Level III [34742]

## 2010-12-15 ENCOUNTER — Encounter (HOSPITAL_BASED_OUTPATIENT_CLINIC_OR_DEPARTMENT_OTHER): Payer: Medicare Other | Attending: General Surgery

## 2010-12-15 DIAGNOSIS — Y998 Other external cause status: Secondary | ICD-10-CM | POA: Insufficient documentation

## 2010-12-15 DIAGNOSIS — T22219A Burn of second degree of unspecified forearm, initial encounter: Secondary | ICD-10-CM | POA: Insufficient documentation

## 2010-12-15 DIAGNOSIS — Y93G9 Activity, other involving cooking and grilling: Secondary | ICD-10-CM | POA: Insufficient documentation

## 2010-12-15 DIAGNOSIS — Y92009 Unspecified place in unspecified non-institutional (private) residence as the place of occurrence of the external cause: Secondary | ICD-10-CM | POA: Insufficient documentation

## 2010-12-15 DIAGNOSIS — X020XXA Exposure to flames in controlled fire in building or structure, initial encounter: Secondary | ICD-10-CM | POA: Insufficient documentation

## 2010-12-19 ENCOUNTER — Encounter: Payer: Self-pay | Admitting: Internal Medicine

## 2011-01-26 ENCOUNTER — Encounter (HOSPITAL_BASED_OUTPATIENT_CLINIC_OR_DEPARTMENT_OTHER): Payer: Medicare Other | Attending: General Surgery

## 2011-01-26 DIAGNOSIS — T22219A Burn of second degree of unspecified forearm, initial encounter: Secondary | ICD-10-CM | POA: Insufficient documentation

## 2011-01-26 DIAGNOSIS — Y92009 Unspecified place in unspecified non-institutional (private) residence as the place of occurrence of the external cause: Secondary | ICD-10-CM | POA: Insufficient documentation

## 2011-01-26 DIAGNOSIS — Y93G9 Activity, other involving cooking and grilling: Secondary | ICD-10-CM | POA: Insufficient documentation

## 2011-01-26 DIAGNOSIS — Y998 Other external cause status: Secondary | ICD-10-CM | POA: Insufficient documentation

## 2011-01-26 DIAGNOSIS — X020XXA Exposure to flames in controlled fire in building or structure, initial encounter: Secondary | ICD-10-CM | POA: Insufficient documentation

## 2011-01-30 ENCOUNTER — Ambulatory Visit (INDEPENDENT_AMBULATORY_CARE_PROVIDER_SITE_OTHER): Payer: Medicare Other | Admitting: Internal Medicine

## 2011-01-30 ENCOUNTER — Encounter: Payer: Self-pay | Admitting: Internal Medicine

## 2011-01-30 DIAGNOSIS — R739 Hyperglycemia, unspecified: Secondary | ICD-10-CM

## 2011-01-30 DIAGNOSIS — T22039A Burn of unspecified degree of unspecified upper arm, initial encounter: Secondary | ICD-10-CM

## 2011-01-30 DIAGNOSIS — R7303 Prediabetes: Secondary | ICD-10-CM | POA: Insufficient documentation

## 2011-01-30 DIAGNOSIS — R7309 Other abnormal glucose: Secondary | ICD-10-CM

## 2011-01-30 DIAGNOSIS — I10 Essential (primary) hypertension: Secondary | ICD-10-CM

## 2011-01-30 DIAGNOSIS — F411 Generalized anxiety disorder: Secondary | ICD-10-CM

## 2011-01-30 LAB — LIPID PANEL
Cholesterol: 168 mg/dL (ref 0–200)
HDL: 54.2 mg/dL (ref >39.00)
Triglycerides: 69 mg/dL (ref 0.0–149.0)
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
## 2011-01-30 NOTE — Assessment & Plan Note (Signed)
chart reviewed , noted mild hyperglycemia, labs

## 2011-01-30 NOTE — Assessment & Plan Note (Signed)
Well controlled at present . No change

## 2011-01-30 NOTE — Assessment & Plan Note (Signed)
Still has problems, f/u @ the wound center

## 2011-02-05 ENCOUNTER — Other Ambulatory Visit: Payer: Self-pay | Admitting: *Deleted

## 2011-02-05 MED ORDER — ALPRAZOLAM 0.5 MG PO TABS: ORAL_TABLET | ORAL | Status: DC

## 2011-02-05 NOTE — Telephone Encounter (Signed)
90 , 4 RF

## 2011-02-16 ENCOUNTER — Encounter (HOSPITAL_BASED_OUTPATIENT_CLINIC_OR_DEPARTMENT_OTHER): Payer: Medicare Other | Attending: General Surgery

## 2011-02-16 DIAGNOSIS — T310 Burns involving less than 10% of body surface: Secondary | ICD-10-CM | POA: Insufficient documentation

## 2011-02-16 DIAGNOSIS — I10 Essential (primary) hypertension: Secondary | ICD-10-CM | POA: Insufficient documentation

## 2011-02-16 DIAGNOSIS — Z79899 Other long term (current) drug therapy: Secondary | ICD-10-CM | POA: Insufficient documentation

## 2011-02-16 DIAGNOSIS — T22219A Burn of second degree of unspecified forearm, initial encounter: Secondary | ICD-10-CM | POA: Insufficient documentation

## 2011-02-16 DIAGNOSIS — X19XXXA Contact with other heat and hot substances, initial encounter: Secondary | ICD-10-CM | POA: Insufficient documentation

## 2011-03-01 ENCOUNTER — Other Ambulatory Visit: Payer: Self-pay | Admitting: Internal Medicine

## 2011-03-02 NOTE — Op Note (Signed)
[facility]  Patient:    Cheryl Armstrong, Cheryl Armstrong                    MRN: [id number] Proc. Date: 04/07/01 Attending:  Ollen Gross. Vernell Morgans, M.D.                           Operative Report  PREOPERATIVE DIAGNOSIS:  Cholelithiasis.  POSTOPERATIVE DIAGNOSIS:  Cholelithiasis.  PROCEDURE:  Laparoscopic cholecystectomy.  SURGEON:  Ollen Gross. Vernell Morgans, M.D.  ASSISTANT:  Gita Kudo, M.D.  ANESTHESIA:  General endotracheal.  DESCRIPTION OF PROCEDURE:  After informed consent was obtained, the patient was brought to the operating room and placed in the supine position on the operating room table.  After adequate induction of general anesthesia, the patients abdomen was prepped with Betadine and draped in the usual sterile manner.  The supraumbilical area was infiltrated with 0.25% Marcaine, and a small transverse incision was made with the 15 blade knife.  This incision was carried down through the subcutaneous tissue using blunt dissection with [employer] retractors and a Kelly clamp until the linea alba was identified. The linea alba was incised with the 15 blade knife, and each side was grasped with Kocher clamps and elevated.  The preperitoneal space was probed bluntly with a hemostat until the peritoneum was opened and access was gained to the abdominal cavity.  A finger was inserted through this opening to palpate the anterior abdominal wall, and there were no apparent adhesions.  A 0 Vicryl pursestring stitch was then placed in the fascia surrounding this opening. The Hasson cannula was then placed through this opening and anchored in place with the previously placed Vicryl pursestring stitch.  The abdomen was then insufflated with carbon dioxide without difficulty.  The laparoscope was inserted through the Hasson cannula.  The dome of the gallbladder and liver edge were readily identifiable.  The patient was placed in the reverse Trendelenburg position.  An  upper midline small transverse incision was made with the 15 blade knife after infiltrating this area with 0.25% Marcaine, and a 10 mm port was placed bluntly through this incision into the abdominal cavity under direct vision.  Two places were chosen laterally on the right side of the abdomen for placement of 5 mm ports, and these areas were infiltrated with 0.25% Marcaine, and small stab incisions were made with the 15 blade knife.  The two 5 mm trocars were placed through these incisions bluntly into the abdominal cavity, again under direct vision.  A blunt grasper was placed through the lateral-most 5 mm port and used to grasp the dome of the gallbladder and elevate it anteriorly and superiorly.  Another blunt grasper was placed through the other 5 mm trocar and used to retract on the body and neck of the gallbladder.  The [place] dissector was placed through the upper midline port and using a combination of the electrocautery and blunt dissection, the peritoneal reflection overlying the gallbladder neck was opened, and blunt dissection was carried out into the gallbladder neck. Cystic duct junction was identified, and this area was dissected in a circumferential manner, taking care to keep the common duct medial to this dissection.  Three clips were then placed proximally and one distally on the cystic duct, and the duct was divided between the two.  The cystic artery was identified posterior to this, and two clips were placed proximally and one distally on the artery, and the  artery was divided between the two.  Next, using the laparoscopic hook cautery the gallbladder was dissected away from the liver bed.  Prior to completely removing the gallbladder from the liver bed, the liver bed was inspected, and several small bleeding points were coagulated with the electrocautery.  The gallbladder was then removed the rest of the way from the liver bed, and the liver bed was examined and  found to be hemostatic.  The laparoscope was then removed to the upper midline port, and the gallbladder grasper was placed through the Hasson cannula and used to grasp the neck of the gallbladder.  The gallbladder was then removed with the Hasson cannula through the supraumbilical port.  The fascia of the supraumbilical port was then closed under direct vision with the previously placed 0 Vicryl pursestring stitch.  The abdomen was then irrigated with copious amounts of saline until the effluent was clear.  The ports were then all removed under direct vision and were found to be hemostatic.  The skin incisions were closed with 4-0 Monocryl subcuticular stitches.  Benzoin and Steri-Strips were applied.  The patient tolerated the procedure well.  At the end of the case, all sponge, needle and instrument counts were correct.  The patient was awakened and taken to the recovery room in stable condition. DD:  04/07/01 TD:  04/07/01 Job: [id number]

## 2011-03-02 NOTE — Op Note (Signed)
NAME:  Cheryl Armstrong, Cheryl Armstrong                     ACCOUNT NO.:  [id number]   MEDICAL RECORD NO.:  [id number]                   PATIENT TYPE:  OIB   LOCATION:  2861                                 FACILITY:  [facility]   PHYSICIAN:  Alford Highland. Rankin, M.D.                DATE OF BIRTH:  07-17-1928   DATE OF PROCEDURE:  03/19/2003  DATE OF DISCHARGE:                                 OPERATIVE REPORT   PREOPERATIVE DIAGNOSIS:  1. Proliferative vitreoretinopathy, stage C2, macula off.  2. Retinal detachment, right eye.  3. Rhegmatogenous retinal detachment, right eye.   POSTOPERATIVE DIAGNOSES:  1. Proliferative vitreo retinopathy, stage C2, macula off.  2. Retinal detachment, right eye.  3. Rhegmatogenous retinal detachment, right eye.  4. Macular hole, right eye, found upon unfolding of the macular region which     was detached and prevented view of the fovea.  5. Small choroidal elevation, suspected local suprachoroidal hemorrhage     temporally in the right eye.   PROCEDURE:  1. Posterior vitrectomy with membrane peel, OD, of multiple retinal star     folds, inferotemporal and superotemporal.  2. Scleral buckle using two 4-0 explants encircling 360 degrees with a 7-0     Watzke sleeve in the superonasal quadrant of the left eye.  3. Injection of vitreous substitute, permanent, silicone oil 5000     centistokes.  4. Retinal cryopexy, OD.  5. External sclerotomy cutdown on the temporal quadrant to help drain any     choroidal elevation. No aggressive attempt was made to drain the     suprachoroidal space because of the risk of persistent bleeding.   SURGEON:  Alford Highland. Rankin, M.D.   ANESTHESIA:  General endotracheal anesthesia.   INDICATIONS FOR PROCEDURE:  The patient is a 77 year old woman with profound  vision loss noted suddenly some 3-1/2 weeks previously. She was seen 1 day  previously in the office and was found by Dr. Neva Seat and his staff to have a  rhegmatogenous detachment of  the right eye. She was seen and booked for  surgery today.   She was found on the preoperative examination to have a macular off retinal  detachment and profound visual loss of the right eye, a large retinal break  at the 10:30 position with the detachment sitting from the 11:30 position  inferiorly down around to the 5:30 position with star folds at the 5:30, 6  o'clock, and the 7:30 positions. These suggest longstanding retinal  detachment of at least 2 months' duration with this type of star fold  configuration likely and some rigidity to the retina seen by preoperative  ultrasound  dynamic and statis.   The patient understands the risks of anesthesia including  the risk death  but also to the eye, including but not limited to from the disease and  surgery hemorrhage, infection, scarring, need for another surgery, no change  in vision, loss of vision  and progression of disease despite intervention.  After appropriate signed consent was obtained, the patient was taken to the  operating room.   DESCRIPTION OF PROCEDURE:  In the operating room appropriate monitoring was  followed  by general endotracheal anesthesia. The right periocular region  was thoroughly prepped and draped in the usual sterile fashion. The lid  speculum was applied. A conjunctival peritomy was then fashioned 360  degrees. A relaxing incision was made inferonasal and superotemporal. The  rectus muscle was isolated on 2-0 silk ties. An in depth ophthalmoscopy was  performed and a retinal cryopexy was placed around the retinal break in the  superotemporal quadrant, as well as along the oris serrata region in  the  bed of the detachment.   At this time a vitrectomy instrumentation was then started. A 4-mm infusion  was secured 3.5 mm posterior  to the limbus and inferotemporal quadrant.  Replacement of vitreous carried out visually. A superior sclerotomy was then  fashioned while the microscope was placed in position  with the __________  attached. A core vitrectomy was then begun. Iatrogenic posterior vitreal  detachment was necessary because of the dense vitreoretinal attachments 360  degrees. In the midst of the unfolding of the retinal fold over the macular  region and a large macular hole was discovered approximately 400 to 500  microns in size. Fluid-fluid exchange was carried out. The peripheral  vitreous was removed as carefully as possible.   The planned lensectomy was begun. A posterior capsule entry was made.  Internal hydrodissection was then carried out in the bag.  Phacoemulsification was carried out in the bag. Cortical cleanup was carried  out in the bag. The remnants of the anterior and posterior capsule lesion  were then removed to prevent use as a scaffold for proliferative vitreal  retinopathy. Skeletal pressure was then used in the regions of the anterior  vitreous and the haloid to remove this region of the haloid.   At this time a small elevation in the temporal retina was noted. Fluid-fluid  exchange was completed. Fluid-air exchange was then completed.   Under air with the sclerotomies closed and intraocular pressure of 35 mm, a  scleral buckle was then placed under the rectus muscles and secured as  previously mentioned on the superior lens quadrant with the Watzke sleeve.  It was tied to the appropriate tension. At this time instruments were  replaced into the eye using the [facility] Visualization system,  and in fact,  the choroidal elevation template was slightly larger. Fluid-air exchange was  then recompleted.   It was judged that the choroidal elevation while growing slowly was  dangerous and might pose a risk for massive suprachoroidal hemorrhage. At  this time the retina was flattened nicely 360 degrees on the buckle and over  the slope of the temporal choroidal. The macula was flat. It was judged  dangerous, in my opinion to proceed with endolaser photocoagulation.  The retinal holes had been all previously treated with retinal cryopexy in the  superotemporal quadrant as well as the inferior quadrant at the site of the  star fold dissection. For this reason I elected to place silicone oil in the  eye passively under direct visualization and enclose the supersclerotomies  to maintain the viability of the globe. It must be noted that prior to this  and while under air and during the performance of the lensectomy and  vitrectomy, instrumentation had been used to create an inferior  peripheral  iridectomy.   At this time the sclerotomies were closed with 7-0 Vicryl suture. The  infusion __________  were closed with 7-0 Vicryl suture. The bed of the  buckle was irrigated copiously with bug juice. The conjunctival tenons were  then brought fourth and closed in layers with 7-0 Vicryl suture. Indirect  ophthalmoscopy confirmed  that the retina was attached and the choroidal  elevation had not progressed in the temporal quadrant.   The patient was awakened from anesthesia after the subconjunctival injection  of antibiotics was applied. A sterile patch and Fox shield had been applied.  She was taken to the recovery room in good stable condition.                                               Alford Highland Rankin, M.D.    GAR/MEDQ  D:  03/19/2003  T:  03/21/2003  Job:  [id number]   cc:   Estelle June. Neva Seat, M.D.  [address] [place]  [place] [address]  Fax: [phone]

## 2011-03-02 NOTE — Op Note (Signed)
NAME:  Cheryl Armstrong, Cheryl Armstrong                     ACCOUNT NO.:  [id number]   MEDICAL RECORD NO.:  [id number]                   PATIENT TYPE:  OIB   LOCATION:  2899                                 FACILITY:  [facility]   PHYSICIAN:  Alford Highland. Rankin, M.D.                DATE OF BIRTH:  1928/03/30   DATE OF PROCEDURE:  02/14/2004  DATE OF DISCHARGE:  02/14/2004                                 OPERATIVE REPORT   PREOPERATIVE DIAGNOSES:  1. Retained silicone oil implant, right eye, vitreous cavity.  2. Previous repair of complex chronic rhegmatogenous detachment, macula,     right eye.  3. Large chronic macular hole, right eye, contributing to #2 above.   POSTOPERATIVE DIAGNOSES:  1. Retained silicone oil implant, right eye, vitreous cavity.  2. Previous repair of complex chronic rhegmatogenous detachment, macula,     right eye.  3. Large chronic macular hole, right eye, contributing to #2 above.   PROCEDURE:  1. Posterior vitrectomy and endolaser pan photocoagulation, right eye, for     retinopexy purposes inferiorly in the area of tractional change,     posterior buckle.  2. Removal of magnetic intraocular foreign body - implant - silicone oil,     right eye.   SURGEON:  Alford Highland. Rankin, M.D.   ANESTHESIA:  Local retrobulbar with monitored anesthesia control.   INDICATIONS:  The patient is a 77 year old woman with profound vision loss  on the basis of previous history of combined tractional rhegmatogenous  detachment, macular hole with chronicity.  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  This is an attempt to release and then remove the silicone oil to prevent  complications but also to allow more potential visual rehabilitation to  maximize peripheral vision in this ambulatory patient with functioning right  eye.  The patient understands the risks of anesthesia including the rare  occurrence of death, damage to the  eye including but not limited to  hemorrhage, infection, scarring, need for another surgery, no change in  vision, loss of vision and progression of disease despite intervention.  Appropriate signed consent was obtained.  The patient was taken to the  operating room.   DESCRIPTION OF PROCEDURE:  In the operating room, appropriate monitors were  applied and after mild sedation, 0.5% Marcaine delivered 5 mL in the  retrobulbar and additional 5 mL delivered fashion of modified Darel Hong.  The  right periocular region was sterilely prepped and draped in the usual  ophthalmic fashion.  Lid speculum applied.  Conjunctival peritomy was then  fashioned in each of the quadrants with the exception of the infranasal.  A  4 mm infusion was secured 3/5 mm posterior to the limbus in the  infratemporal quadrant.  Placement in the vitreous cavity was verified prior  to beginning the infusion.  The superior sclerotomy was fashioned.  The [employer] was  placed into position with the BIOM attached.  Silicone oil  extraction carried out without difficulty.  Oil removed with no problems.  Vitrectomy instrumentation was then used to remove smaller amounts of the  bubbles of oil.  Tractional changes along the inferior nasal retina,  posterior ___________  buckle identified with no retinopexy in that region.  Retinopexy was thus applied with laser posterior to this fold so as to  secure the retina and prevent further progression of retinal tear or a  detachment.  At this time, the instruments were removed from the eye.  All  large portions of the oil had been removed clearly from the anterior  chamber.  At this time, the superior sclerotomy was closed with 7-0 Vicryl.  The infusion was removed and slowly closed with 7-0 Vicryl.  Conjunctiva The  patient tolerated the procedure well without complications.                                               Alford Highland Rankin, M.D.    GAR/MEDQ  D:  02/14/2004  T:   02/14/2004  Job:  [id number]

## 2011-03-15 ENCOUNTER — Ambulatory Visit: Payer: Medicare Other | Admitting: Internal Medicine

## 2011-03-30 ENCOUNTER — Encounter (HOSPITAL_BASED_OUTPATIENT_CLINIC_OR_DEPARTMENT_OTHER): Payer: Medicare Other | Attending: General Surgery

## 2011-03-30 DIAGNOSIS — X19XXXA Contact with other heat and hot substances, initial encounter: Secondary | ICD-10-CM | POA: Insufficient documentation

## 2011-03-30 DIAGNOSIS — T22219A Burn of second degree of unspecified forearm, initial encounter: Secondary | ICD-10-CM | POA: Insufficient documentation

## 2011-03-30 DIAGNOSIS — I10 Essential (primary) hypertension: Secondary | ICD-10-CM | POA: Insufficient documentation

## 2011-03-30 DIAGNOSIS — Z79899 Other long term (current) drug therapy: Secondary | ICD-10-CM | POA: Insufficient documentation

## 2011-03-30 DIAGNOSIS — T310 Burns involving less than 10% of body surface: Secondary | ICD-10-CM | POA: Insufficient documentation

## 2011-04-27 ENCOUNTER — Encounter (HOSPITAL_BASED_OUTPATIENT_CLINIC_OR_DEPARTMENT_OTHER): Payer: Medicare Other | Attending: General Surgery

## 2011-04-27 DIAGNOSIS — I10 Essential (primary) hypertension: Secondary | ICD-10-CM | POA: Insufficient documentation

## 2011-04-27 DIAGNOSIS — A4902 Methicillin resistant Staphylococcus aureus infection, unspecified site: Secondary | ICD-10-CM | POA: Insufficient documentation

## 2011-04-27 DIAGNOSIS — X19XXXA Contact with other heat and hot substances, initial encounter: Secondary | ICD-10-CM | POA: Insufficient documentation

## 2011-04-27 DIAGNOSIS — Z79899 Other long term (current) drug therapy: Secondary | ICD-10-CM | POA: Insufficient documentation

## 2011-04-27 DIAGNOSIS — T22219A Burn of second degree of unspecified forearm, initial encounter: Secondary | ICD-10-CM | POA: Insufficient documentation

## 2011-04-27 DIAGNOSIS — T310 Burns involving less than 10% of body surface: Secondary | ICD-10-CM | POA: Insufficient documentation

## 2011-05-01 ENCOUNTER — Other Ambulatory Visit: Payer: Self-pay | Admitting: Internal Medicine

## 2011-05-18 ENCOUNTER — Encounter (HOSPITAL_BASED_OUTPATIENT_CLINIC_OR_DEPARTMENT_OTHER): Payer: Medicare Other | Attending: General Surgery

## 2011-05-18 DIAGNOSIS — A4902 Methicillin resistant Staphylococcus aureus infection, unspecified site: Secondary | ICD-10-CM | POA: Insufficient documentation

## 2011-05-18 DIAGNOSIS — X19XXXA Contact with other heat and hot substances, initial encounter: Secondary | ICD-10-CM | POA: Insufficient documentation

## 2011-05-18 DIAGNOSIS — Z79899 Other long term (current) drug therapy: Secondary | ICD-10-CM | POA: Insufficient documentation

## 2011-05-18 DIAGNOSIS — I10 Essential (primary) hypertension: Secondary | ICD-10-CM | POA: Insufficient documentation

## 2011-05-18 DIAGNOSIS — T22219A Burn of second degree of unspecified forearm, initial encounter: Secondary | ICD-10-CM | POA: Insufficient documentation

## 2011-05-18 DIAGNOSIS — T310 Burns involving less than 10% of body surface: Secondary | ICD-10-CM | POA: Insufficient documentation

## 2011-05-23 ENCOUNTER — Ambulatory Visit (INDEPENDENT_AMBULATORY_CARE_PROVIDER_SITE_OTHER): Payer: Medicare Other | Admitting: Internal Medicine

## 2011-05-23 ENCOUNTER — Encounter: Payer: Self-pay | Admitting: Internal Medicine

## 2011-05-23 DIAGNOSIS — R5383 Other fatigue: Secondary | ICD-10-CM | POA: Insufficient documentation

## 2011-05-23 DIAGNOSIS — F411 Generalized anxiety disorder: Secondary | ICD-10-CM

## 2011-05-23 DIAGNOSIS — E538 Deficiency of other specified B group vitamins: Secondary | ICD-10-CM

## 2011-05-23 DIAGNOSIS — R269 Unspecified abnormalities of gait and mobility: Secondary | ICD-10-CM

## 2011-05-23 NOTE — Assessment & Plan Note (Addendum)
Has a number of sx , most of them likely due to depression from the los of her husband, the pt agrees with my observation. Plan; General labs including vitamin levels to r/o B12 def  PT referal to help with equilibrium and prevent falls, if possible at home Use a cane ! Increase treatment for anxiety

## 2011-05-23 NOTE — Assessment & Plan Note (Addendum)
Long h/o anxiety complicated now by depression after the loss of her husband 08-2010. Recommend to continue w/  Counseling at church prozac was initiated 10-2010, will increase dose to 40 mg and consider change meds if no response  She has great family support

## 2011-05-23 NOTE — Progress Notes (Signed)
  Subjective:    Patient ID: Cheryl Armstrong, female    DOB: 05/02/1928, 77 y.o.   MRN: [id number]  HPI Here w/ Kennon Rounds , her daughter in law: Few minutes before the visit I was handed a page-long letter with a # observations from the children in regards the pt: Decreased energy, not feeling well, very weak, unable to sit upright for long periods of time, lessactive, doing less work  around the house . Seems less interested in playing cards with friends or visiting friends. Slower mentally and, having a hard time understanding when they talk to her. Does not feel like eating, has lost some weight although she feels a stronger after eating. Seems to be more unsteady on her feet, fell 3 times. Afraid to drive, has gotten lost in well known areas Interaction between medicines?  Past Medical History  Diagnosis Date  . Anxiety   . Hypertension   . Osteopenia   . Retinal detachment    Past Surgical History  Procedure Date  . Cholecystectomy   . Abdominal hysterectomy   . Oophorectomy   . Cataract extraction 01/2008    left eye   History   Social History  . Marital Status: Widowed    Spouse Name: N/A    Number of Children: 2  . Years of Education: N/A   Occupational History  .     Social History Main Topics  . Smoking status: Never Smoker   . Smokeless tobacco: Not on file  . Alcohol Use: Yes     rarely  . Drug Use: Not on file  . Sexually Active: Not on file   Other Topics Concern  . Not on file   Social History Narrative   Married, lost husband 08-2010-----Lives by herself---Sons, live close to her house----- --- G-sons     Review of Systems No chest pain or shortness of breath No low extremity edema Good ambulatory BP control Denies any postprandial abdominal pain, nausea, vomiting. Sleeping well The patient admits to increase anxiety and depression since she lost her husband last year. She has been undergoing counseling at church. We started Prozac 10/2010  which helped some. Xanax 1 tablet 3 times a day definitely helping. Neither the family or the patient have noted slurred speech, focal deficits or facial paralysis.     Objective:   Physical Exam  Constitutional: She is oriented to person, place, and time. She appears well-developed. No distress.       Underweight-appearing, looks slightly frail  Cardiovascular: Normal rate, regular rhythm and normal heart sounds.   No murmur heard. Pulmonary/Chest: Breath sounds normal. No respiratory distress. She has no wheezes. She has no rales.  Musculoskeletal: She exhibits no edema.  Neurological: She is alert and oriented to person, place, and time.       No formal neurological exam however she is slightly unsteady on her feet, no obvious facial or motor deficits  Skin: She is not diaphoretic.  Psychiatric:       Slightly anxious appearing, not far from baseline. Mildly depress          Assessment & Plan:  Today , I spent ~ 40 than  min with the patient, >50% of the time counseling

## 2011-05-23 NOTE — Patient Instructions (Addendum)
Increase prozac Physical therapy

## 2011-05-24 LAB — CBC WITH DIFFERENTIAL/PLATELET
Basophils Absolute: 0 10*3/uL (ref 0.0–0.1)
Eosinophils Relative: 1.5 % (ref 0.0–5.0)
Lymphocytes Relative: 33.8 % (ref 12.0–46.0)
Monocytes Relative: 8.7 % (ref 3.0–12.0)
Platelets: 247 10*3/uL (ref 150.0–400.0)
RDW: 13.9 % (ref 11.5–14.6)
WBC: 5.7 10*3/uL (ref 4.5–10.5)

## 2011-05-24 LAB — BASIC METABOLIC PANEL
BUN: 36 mg/dL — ABNORMAL HIGH (ref 6–23)
Calcium: 9.5 mg/dL (ref 8.4–10.5)
GFR: 63.02 mL/min (ref >60.00)
Glucose, Bld: 94 mg/dL (ref 70–99)
Potassium: 5.6 mEq/L — ABNORMAL HIGH (ref 3.5–5.1)
Sodium: 128 mEq/L — ABNORMAL LOW (ref 135–145)

## 2011-05-24 LAB — VITAMIN B12: Vitamin B-12: 416 pg/mL (ref 211–911)

## 2011-05-24 LAB — FOLATE: Folate: >24.8 ng/mL (ref >5.9)

## 2011-05-24 LAB — TSH: TSH: 2.12 u[IU]/mL (ref 0.35–5.50)

## 2011-05-25 ENCOUNTER — Telehealth: Payer: Self-pay | Admitting: *Deleted

## 2011-05-25 NOTE — Telephone Encounter (Signed)
Pt informed of results. No OTC supplements [taking Rx Cozaar] No Diuretics being taken. Appointment scheduled for repeat BMP 05/31/11 @ 8:30AM

## 2011-05-25 NOTE — Telephone Encounter (Signed)
Message copied by Regis Bill on Fri May 25, 2011  4:00 PM ------      Message from: Willow Ora E      Created: Fri May 25, 2011  7:50 AM       Please discuss the results with the patient:      Potassium is elevated. If she is taking any over-the-counter supplements she needs to stop.       Her sodium is low, unclear why, is she taking any diuretic? ( she is not supposed to )      Discuss and mail a  low K diet       Please come back in one week for a BMP rechecked, DX hyperkalemia

## 2011-05-25 NOTE — Telephone Encounter (Signed)
Noted, may need to d/c ARBs

## 2011-05-30 ENCOUNTER — Other Ambulatory Visit: Payer: Self-pay | Admitting: Internal Medicine

## 2011-05-30 ENCOUNTER — Ambulatory Visit: Payer: Medicare Other | Attending: Internal Medicine

## 2011-05-30 DIAGNOSIS — R262 Difficulty in walking, not elsewhere classified: Secondary | ICD-10-CM | POA: Insufficient documentation

## 2011-05-30 DIAGNOSIS — IMO0001 Reserved for inherently not codable concepts without codable children: Secondary | ICD-10-CM | POA: Insufficient documentation

## 2011-05-30 DIAGNOSIS — M6281 Muscle weakness (generalized): Secondary | ICD-10-CM | POA: Insufficient documentation

## 2011-05-30 DIAGNOSIS — R269 Unspecified abnormalities of gait and mobility: Secondary | ICD-10-CM | POA: Insufficient documentation

## 2011-05-30 DIAGNOSIS — E875 Hyperkalemia: Secondary | ICD-10-CM

## 2011-05-31 ENCOUNTER — Other Ambulatory Visit (INDEPENDENT_AMBULATORY_CARE_PROVIDER_SITE_OTHER): Payer: Medicare Other

## 2011-05-31 ENCOUNTER — Ambulatory Visit: Payer: Medicare Other

## 2011-05-31 DIAGNOSIS — E875 Hyperkalemia: Secondary | ICD-10-CM

## 2011-05-31 LAB — BASIC METABOLIC PANEL
BUN: 25 mg/dL — ABNORMAL HIGH (ref 6–23)
CO2: 31 mEq/L (ref 19–32)
Calcium: 9.3 mg/dL (ref 8.4–10.5)
Creatinine, Ser: 1 mg/dL (ref 0.4–1.2)
GFR: 58.54 mL/min — ABNORMAL LOW (ref >60.00)
Glucose, Bld: 97 mg/dL (ref 70–99)
Sodium: 133 mEq/L — ABNORMAL LOW (ref 135–145)

## 2011-05-31 NOTE — Progress Notes (Signed)
12 Labs only  

## 2011-05-31 NOTE — Progress Notes (Signed)
Labs only

## 2011-06-04 ENCOUNTER — Ambulatory Visit: Payer: Medicare Other

## 2011-06-06 ENCOUNTER — Ambulatory Visit: Payer: Medicare Other

## 2011-06-08 ENCOUNTER — Other Ambulatory Visit: Payer: Self-pay | Admitting: Internal Medicine

## 2011-06-08 MED ORDER — FLUOXETINE HCL 20 MG PO CAPS: 20.0000 mg | ORAL_CAPSULE | Freq: Two times a day (BID) | ORAL | Status: DC

## 2011-06-08 NOTE — Telephone Encounter (Signed)
Patient said dose for fluoxetine hcl was increased but [facility] didn't get new rx

## 2011-06-08 NOTE — Telephone Encounter (Signed)
Refilled per notation in phone note from 05/23/2011 office visit.

## 2011-06-11 ENCOUNTER — Ambulatory Visit: Payer: Medicare Other | Admitting: Physical Therapy

## 2011-06-11 ENCOUNTER — Other Ambulatory Visit: Payer: Self-pay

## 2011-06-12 ENCOUNTER — Ambulatory Visit: Payer: Medicare Other | Admitting: Internal Medicine

## 2011-06-13 ENCOUNTER — Ambulatory Visit: Payer: Medicare Other

## 2011-06-19 ENCOUNTER — Ambulatory Visit: Payer: Medicare Other | Attending: Internal Medicine | Admitting: Physical Therapy

## 2011-06-19 DIAGNOSIS — M6281 Muscle weakness (generalized): Secondary | ICD-10-CM | POA: Insufficient documentation

## 2011-06-19 DIAGNOSIS — R269 Unspecified abnormalities of gait and mobility: Secondary | ICD-10-CM | POA: Insufficient documentation

## 2011-06-19 DIAGNOSIS — IMO0001 Reserved for inherently not codable concepts without codable children: Secondary | ICD-10-CM | POA: Insufficient documentation

## 2011-06-19 DIAGNOSIS — R262 Difficulty in walking, not elsewhere classified: Secondary | ICD-10-CM | POA: Insufficient documentation

## 2011-06-20 ENCOUNTER — Ambulatory Visit: Payer: Medicare Other

## 2011-06-20 ENCOUNTER — Ambulatory Visit (INDEPENDENT_AMBULATORY_CARE_PROVIDER_SITE_OTHER): Payer: Medicare Other | Admitting: Internal Medicine

## 2011-06-20 ENCOUNTER — Encounter: Payer: Self-pay | Admitting: Internal Medicine

## 2011-06-20 DIAGNOSIS — R5383 Other fatigue: Secondary | ICD-10-CM

## 2011-06-20 DIAGNOSIS — I10 Essential (primary) hypertension: Secondary | ICD-10-CM

## 2011-06-20 DIAGNOSIS — F411 Generalized anxiety disorder: Secondary | ICD-10-CM

## 2011-06-20 LAB — BASIC METABOLIC PANEL
BUN: 31 mg/dL — ABNORMAL HIGH (ref 6–23)
Calcium: 9.4 mg/dL (ref 8.4–10.5)
Creatinine, Ser: 0.8 mg/dL (ref 0.4–1.2)
GFR: 70.06 mL/min (ref >60.00)

## 2011-06-20 NOTE — Assessment & Plan Note (Signed)
Off losartan d/e to elevated K Labs BP stable:  See instructions

## 2011-06-20 NOTE — Patient Instructions (Signed)
No losartan Take meds as prescribed, see below

## 2011-06-20 NOTE — Assessment & Plan Note (Signed)
Overall better although not sure if she is on increased prozac See instructions

## 2011-06-20 NOTE — Progress Notes (Signed)
  Subjective:    Patient ID: Cheryl Armstrong, female    DOB: 06-Jun-1928, 77 y.o.   MRN: [id number]  HPI F/u last visit, overall feels better  Past Medical History  Diagnosis Date  . Anxiety   . Hypertension   . Osteopenia   . Retinal detachment    Past Surgical History  Procedure Date  . Cholecystectomy   . Abdominal hysterectomy   . Oophorectomy   . Cataract extraction 01/2008    left eye     Review of Systems Good compliance w/ PT, feeling stronger "thinks" she increased prozac, cont w/  Xanax 2-3 times a day, overall anxiety has decreaesed She did stop losartan as recommended, amb BPs ok; states she takes 14 onz of a protein shake every day     Objective:   Physical Exam  Constitutional: She is oriented to person, place, and time. She appears well-developed and well-nourished. No distress.       Seems better than last visit, slt stronger, better spirits  Cardiovascular: Normal rate, regular rhythm and normal heart sounds.   No murmur heard. Musculoskeletal: She exhibits no edema.  Neurological: She is alert and oriented to person, place, and time.  Skin: She is not diaphoretic.          Assessment & Plan:

## 2011-06-20 NOTE — Assessment & Plan Note (Signed)
Pt reports she is definitely better, sx may have been due to anxiety and deconditioning rec to cont w/ PT

## 2011-06-25 ENCOUNTER — Ambulatory Visit: Payer: Medicare Other

## 2011-06-27 ENCOUNTER — Ambulatory Visit: Payer: Medicare Other | Admitting: Physical Therapy

## 2011-06-29 ENCOUNTER — Encounter (HOSPITAL_BASED_OUTPATIENT_CLINIC_OR_DEPARTMENT_OTHER): Payer: Medicare Other | Attending: General Surgery

## 2011-06-29 DIAGNOSIS — Z79899 Other long term (current) drug therapy: Secondary | ICD-10-CM | POA: Insufficient documentation

## 2011-06-29 DIAGNOSIS — T310 Burns involving less than 10% of body surface: Secondary | ICD-10-CM | POA: Insufficient documentation

## 2011-06-29 DIAGNOSIS — X19XXXA Contact with other heat and hot substances, initial encounter: Secondary | ICD-10-CM | POA: Insufficient documentation

## 2011-06-29 DIAGNOSIS — T22219A Burn of second degree of unspecified forearm, initial encounter: Secondary | ICD-10-CM | POA: Insufficient documentation

## 2011-06-29 DIAGNOSIS — I10 Essential (primary) hypertension: Secondary | ICD-10-CM | POA: Insufficient documentation

## 2011-07-02 ENCOUNTER — Ambulatory Visit: Payer: Medicare Other | Admitting: Physical Therapy

## 2011-07-02 ENCOUNTER — Other Ambulatory Visit: Payer: Self-pay | Admitting: Internal Medicine

## 2011-07-03 ENCOUNTER — Telehealth: Payer: Self-pay | Admitting: Internal Medicine

## 2011-07-03 NOTE — Telephone Encounter (Signed)
Pt states she is taking Prozac capsules bid but one the bottle it states qd.  Pt states she has some tablets left as well. After the capsules should the pt take the tablets.  Per Dr. Drue Novel if the medication is the same mg pt can take the tablets. Pt aware.

## 2011-07-04 ENCOUNTER — Ambulatory Visit: Payer: Medicare Other | Admitting: Physical Therapy

## 2011-07-09 ENCOUNTER — Ambulatory Visit: Payer: Medicare Other | Admitting: Physical Therapy

## 2011-07-12 ENCOUNTER — Ambulatory Visit: Payer: Medicare Other

## 2011-07-16 ENCOUNTER — Other Ambulatory Visit: Payer: Self-pay | Admitting: Internal Medicine

## 2011-07-16 MED ORDER — ALPRAZOLAM 0.5 MG PO TABS: ORAL_TABLET | ORAL | Status: DC

## 2011-07-16 NOTE — Telephone Encounter (Signed)
Rx Done . 

## 2011-07-16 NOTE — Telephone Encounter (Signed)
90, 6 RF

## 2011-07-16 NOTE — Telephone Encounter (Signed)
Alprazolam request [last refill 02/05/11 #90x4]

## 2011-07-27 ENCOUNTER — Encounter (HOSPITAL_BASED_OUTPATIENT_CLINIC_OR_DEPARTMENT_OTHER): Payer: Medicare Other

## 2011-08-20 ENCOUNTER — Other Ambulatory Visit: Payer: Self-pay | Admitting: Internal Medicine

## 2011-08-27 ENCOUNTER — Encounter: Payer: Self-pay | Admitting: Internal Medicine

## 2011-08-30 ENCOUNTER — Other Ambulatory Visit: Payer: Self-pay | Admitting: Internal Medicine

## 2011-09-19 ENCOUNTER — Other Ambulatory Visit: Payer: Self-pay | Admitting: Internal Medicine

## 2011-09-24 ENCOUNTER — Encounter: Payer: Self-pay | Admitting: Internal Medicine

## 2011-09-24 ENCOUNTER — Ambulatory Visit (INDEPENDENT_AMBULATORY_CARE_PROVIDER_SITE_OTHER): Payer: Medicare Other | Admitting: Internal Medicine

## 2011-09-24 VITALS — BP 120/84 | HR 66 | Temp 98.5°F | Ht 62.0 in | Wt 113.0 lb

## 2011-09-24 DIAGNOSIS — T22039A Burn of unspecified degree of unspecified upper arm, initial encounter: Secondary | ICD-10-CM

## 2011-09-24 DIAGNOSIS — F411 Generalized anxiety disorder: Secondary | ICD-10-CM

## 2011-09-24 DIAGNOSIS — I10 Essential (primary) hypertension: Secondary | ICD-10-CM

## 2011-09-24 NOTE — Assessment & Plan Note (Signed)
Well controlled 

## 2011-09-24 NOTE — Assessment & Plan Note (Signed)
Well-controlled without losartan. Last Potassium normal

## 2011-09-24 NOTE — Assessment & Plan Note (Signed)
Reassured, area seems completely healed. Will call if redness or discharge
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
## 2011-10-22 ENCOUNTER — Other Ambulatory Visit: Payer: Self-pay | Admitting: Internal Medicine

## 2011-10-29 ENCOUNTER — Other Ambulatory Visit: Payer: Self-pay | Admitting: Internal Medicine

## 2011-12-17 DIAGNOSIS — H31009 Unspecified chorioretinal scars, unspecified eye: Secondary | ICD-10-CM | POA: Diagnosis not present

## 2011-12-17 DIAGNOSIS — H31019 Macula scars of posterior pole (postinflammatory) (post-traumatic), unspecified eye: Secondary | ICD-10-CM | POA: Diagnosis not present

## 2011-12-17 DIAGNOSIS — H2700 Aphakia, unspecified eye: Secondary | ICD-10-CM | POA: Diagnosis not present

## 2011-12-17 DIAGNOSIS — Z961 Presence of intraocular lens: Secondary | ICD-10-CM | POA: Diagnosis not present

## 2011-12-24 ENCOUNTER — Ambulatory Visit (INDEPENDENT_AMBULATORY_CARE_PROVIDER_SITE_OTHER): Payer: Medicare Other | Admitting: Internal Medicine

## 2011-12-24 ENCOUNTER — Encounter: Payer: Self-pay | Admitting: Internal Medicine

## 2011-12-24 VITALS — BP 140/78 | HR 66 | Temp 97.5°F | Resp 18 | Ht 62.75 in | Wt 113.0 lb

## 2011-12-24 DIAGNOSIS — M949 Disorder of cartilage, unspecified: Secondary | ICD-10-CM | POA: Diagnosis not present

## 2011-12-24 DIAGNOSIS — Z0000 Encounter for general adult medical examination without abnormal findings: Secondary | ICD-10-CM | POA: Diagnosis not present

## 2011-12-24 DIAGNOSIS — R739 Hyperglycemia, unspecified: Secondary | ICD-10-CM

## 2011-12-24 DIAGNOSIS — M899 Disorder of bone, unspecified: Secondary | ICD-10-CM | POA: Diagnosis not present

## 2011-12-24 DIAGNOSIS — Z13220 Encounter for screening for lipoid disorders: Secondary | ICD-10-CM

## 2011-12-24 DIAGNOSIS — F411 Generalized anxiety disorder: Secondary | ICD-10-CM | POA: Diagnosis not present

## 2011-12-24 DIAGNOSIS — R634 Abnormal weight loss: Secondary | ICD-10-CM

## 2011-12-24 DIAGNOSIS — I10 Essential (primary) hypertension: Secondary | ICD-10-CM | POA: Diagnosis not present

## 2011-12-24 DIAGNOSIS — R7309 Other abnormal glucose: Secondary | ICD-10-CM | POA: Diagnosis not present

## 2011-12-24 DIAGNOSIS — Z1211 Encounter for screening for malignant neoplasm of colon: Secondary | ICD-10-CM

## 2011-12-24 LAB — HEMOGLOBIN A1C: Hgb A1c MFr Bld: 6 % (ref 4.6–6.5)

## 2011-12-24 LAB — CBC WITH DIFFERENTIAL/PLATELET
Basophils Relative: 0.1 % (ref 0.0–3.0)
Eosinophils Relative: 3.7 % (ref 0.0–5.0)
HCT: 40.2 % (ref 36.0–46.0)
Hemoglobin: 13.4 g/dL (ref 12.0–15.0)
Lymphs Abs: 1.3 10*3/uL (ref 0.7–4.0)
Monocytes Relative: 7 % (ref 3.0–12.0)
Platelets: 204 10*3/uL (ref 150.0–400.0)
RBC: 4.49 Mil/uL (ref 3.87–5.11)
WBC: 5.9 10*3/uL (ref 4.5–10.5)

## 2011-12-24 MED ORDER — ZOSTER VACCINE LIVE 19400 UNT/0.65ML ~~LOC~~ SOLR: 0.6500 mL | Freq: Once | SUBCUTANEOUS | Status: AC

## 2011-12-24 NOTE — Assessment & Plan Note (Signed)
Well-controlled, no change 

## 2011-12-24 NOTE — Assessment & Plan Note (Signed)
Improving.
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
## 2011-12-24 NOTE — Assessment & Plan Note (Addendum)
Td -2004, 2010 Pneumovax 2004 and 2009  shingles shot benefits discussed before and a RX was provided , has not gotten it yet; anew Rx was provided   Used to see Dr  Elana Alm , retired, h/o hysterectomy for benign resasons, never had an abnormal PAP "I don't think I need more PAPs", i agree, she is extremely low risk Mammogram 11-12 --->  normal   never Cscope, discussed iFOB-Cscope, patient is not interested on a colonoscopy but will do an IFOB   Diet --exercise discussed screening for AAA 09-2010 negative Last cholesterol ok

## 2011-12-24 NOTE — Assessment & Plan Note (Signed)
Used to be f/u by gyn, last dexa ~2009 (?) Plan: dexa , cont ca and vit d

## 2011-12-24 NOTE — Assessment & Plan Note (Signed)
Labs

## 2011-12-26 ENCOUNTER — Telehealth: Payer: Self-pay | Admitting: Internal Medicine

## 2011-12-26 MED ORDER — ZOSTER VACCINE LIVE 19400 UNT/0.65ML ~~LOC~~ SOLR: 0.6500 mL | Freq: Once | SUBCUTANEOUS | Status: DC

## 2011-12-26 NOTE — Telephone Encounter (Signed)
Toniann Fail from [facility] states patient is there for shingles vaccine and left it at home. Needs Dr. Drue Novel to call another one in. Patient is at pharmacy waiting.

## 2011-12-26 NOTE — Telephone Encounter (Signed)
Sent over prescription  

## 2011-12-27 ENCOUNTER — Encounter: Payer: Self-pay | Admitting: *Deleted

## 2011-12-27 ENCOUNTER — Other Ambulatory Visit: Payer: Self-pay | Admitting: Internal Medicine

## 2011-12-27 NOTE — Telephone Encounter (Signed)
Refill done.  

## 2011-12-31 ENCOUNTER — Telehealth: Payer: Self-pay

## 2011-12-31 DIAGNOSIS — R269 Unspecified abnormalities of gait and mobility: Secondary | ICD-10-CM

## 2011-12-31 NOTE — Telephone Encounter (Signed)
Please do Dx gait disorder (she is high risk for falls)

## 2011-12-31 NOTE — Telephone Encounter (Signed)
Set up referral

## 2011-12-31 NOTE — Telephone Encounter (Signed)
Please advise 

## 2011-12-31 NOTE — Telephone Encounter (Signed)
Cheryl Armstrong called from PT stating: Patient was in the area and stopped by to see if Dr.Paz had placed order for Physical Therapy, patient states this was discussed at last OV. Phone number for PT is [phone] and Fax number is [phone]

## 2012-01-03 ENCOUNTER — Ambulatory Visit: Payer: Medicare Other | Attending: Internal Medicine | Admitting: Physical Therapy

## 2012-01-03 DIAGNOSIS — R269 Unspecified abnormalities of gait and mobility: Secondary | ICD-10-CM | POA: Diagnosis not present

## 2012-01-03 DIAGNOSIS — M6281 Muscle weakness (generalized): Secondary | ICD-10-CM | POA: Insufficient documentation

## 2012-01-03 DIAGNOSIS — R262 Difficulty in walking, not elsewhere classified: Secondary | ICD-10-CM | POA: Insufficient documentation

## 2012-01-03 DIAGNOSIS — IMO0001 Reserved for inherently not codable concepts without codable children: Secondary | ICD-10-CM | POA: Diagnosis not present

## 2012-01-09 ENCOUNTER — Ambulatory Visit: Payer: Medicare Other | Admitting: Physical Therapy

## 2012-01-09 DIAGNOSIS — IMO0001 Reserved for inherently not codable concepts without codable children: Secondary | ICD-10-CM | POA: Diagnosis not present

## 2012-01-09 DIAGNOSIS — R262 Difficulty in walking, not elsewhere classified: Secondary | ICD-10-CM | POA: Diagnosis not present

## 2012-01-09 DIAGNOSIS — R269 Unspecified abnormalities of gait and mobility: Secondary | ICD-10-CM | POA: Diagnosis not present

## 2012-01-09 DIAGNOSIS — M6281 Muscle weakness (generalized): Secondary | ICD-10-CM | POA: Diagnosis not present

## 2012-01-10 ENCOUNTER — Encounter: Payer: Medicare Other | Admitting: Physical Therapy

## 2012-01-10 ENCOUNTER — Ambulatory Visit: Payer: Medicare Other | Admitting: Physical Therapy

## 2012-01-10 DIAGNOSIS — R262 Difficulty in walking, not elsewhere classified: Secondary | ICD-10-CM | POA: Diagnosis not present

## 2012-01-10 DIAGNOSIS — R269 Unspecified abnormalities of gait and mobility: Secondary | ICD-10-CM | POA: Diagnosis not present

## 2012-01-10 DIAGNOSIS — IMO0001 Reserved for inherently not codable concepts without codable children: Secondary | ICD-10-CM | POA: Diagnosis not present

## 2012-01-10 DIAGNOSIS — M6281 Muscle weakness (generalized): Secondary | ICD-10-CM | POA: Diagnosis not present

## 2012-01-14 ENCOUNTER — Ambulatory Visit: Payer: Medicare Other | Attending: Internal Medicine | Admitting: Physical Therapy

## 2012-01-14 DIAGNOSIS — R269 Unspecified abnormalities of gait and mobility: Secondary | ICD-10-CM | POA: Diagnosis not present

## 2012-01-14 DIAGNOSIS — IMO0001 Reserved for inherently not codable concepts without codable children: Secondary | ICD-10-CM | POA: Diagnosis not present

## 2012-01-14 DIAGNOSIS — M6281 Muscle weakness (generalized): Secondary | ICD-10-CM | POA: Insufficient documentation

## 2012-01-14 DIAGNOSIS — R262 Difficulty in walking, not elsewhere classified: Secondary | ICD-10-CM | POA: Insufficient documentation

## 2012-01-16 ENCOUNTER — Ambulatory Visit: Payer: Medicare Other | Admitting: Physical Therapy

## 2012-01-16 ENCOUNTER — Other Ambulatory Visit: Payer: Medicare Other

## 2012-01-16 DIAGNOSIS — R269 Unspecified abnormalities of gait and mobility: Secondary | ICD-10-CM | POA: Diagnosis not present

## 2012-01-16 DIAGNOSIS — Z1211 Encounter for screening for malignant neoplasm of colon: Secondary | ICD-10-CM

## 2012-01-16 DIAGNOSIS — M6281 Muscle weakness (generalized): Secondary | ICD-10-CM | POA: Diagnosis not present

## 2012-01-16 DIAGNOSIS — R262 Difficulty in walking, not elsewhere classified: Secondary | ICD-10-CM | POA: Diagnosis not present

## 2012-01-16 DIAGNOSIS — Z13220 Encounter for screening for lipoid disorders: Secondary | ICD-10-CM

## 2012-01-16 DIAGNOSIS — IMO0001 Reserved for inherently not codable concepts without codable children: Secondary | ICD-10-CM | POA: Diagnosis not present

## 2012-01-17 LAB — FECAL OCCULT BLOOD, IMMUNOCHEMICAL: Fecal Occult Bld: NEGATIVE

## 2012-01-21 ENCOUNTER — Other Ambulatory Visit: Payer: Self-pay | Admitting: Internal Medicine

## 2012-01-21 ENCOUNTER — Ambulatory Visit: Payer: Medicare Other | Admitting: Physical Therapy

## 2012-01-21 DIAGNOSIS — R269 Unspecified abnormalities of gait and mobility: Secondary | ICD-10-CM | POA: Diagnosis not present

## 2012-01-21 DIAGNOSIS — R262 Difficulty in walking, not elsewhere classified: Secondary | ICD-10-CM | POA: Diagnosis not present

## 2012-01-21 DIAGNOSIS — M6281 Muscle weakness (generalized): Secondary | ICD-10-CM | POA: Diagnosis not present

## 2012-01-21 DIAGNOSIS — IMO0001 Reserved for inherently not codable concepts without codable children: Secondary | ICD-10-CM | POA: Diagnosis not present

## 2012-01-21 NOTE — Telephone Encounter (Signed)
Refill done.  

## 2012-01-23 ENCOUNTER — Ambulatory Visit: Payer: Medicare Other | Admitting: Physical Therapy

## 2012-01-23 DIAGNOSIS — M6281 Muscle weakness (generalized): Secondary | ICD-10-CM | POA: Diagnosis not present

## 2012-01-23 DIAGNOSIS — IMO0001 Reserved for inherently not codable concepts without codable children: Secondary | ICD-10-CM | POA: Diagnosis not present

## 2012-01-23 DIAGNOSIS — R262 Difficulty in walking, not elsewhere classified: Secondary | ICD-10-CM | POA: Diagnosis not present

## 2012-01-23 DIAGNOSIS — R269 Unspecified abnormalities of gait and mobility: Secondary | ICD-10-CM | POA: Diagnosis not present

## 2012-01-28 ENCOUNTER — Ambulatory Visit: Payer: Medicare Other | Admitting: Physical Therapy

## 2012-01-28 DIAGNOSIS — R269 Unspecified abnormalities of gait and mobility: Secondary | ICD-10-CM | POA: Diagnosis not present

## 2012-01-28 DIAGNOSIS — IMO0001 Reserved for inherently not codable concepts without codable children: Secondary | ICD-10-CM | POA: Diagnosis not present

## 2012-01-28 DIAGNOSIS — M6281 Muscle weakness (generalized): Secondary | ICD-10-CM | POA: Diagnosis not present

## 2012-01-28 DIAGNOSIS — R262 Difficulty in walking, not elsewhere classified: Secondary | ICD-10-CM | POA: Diagnosis not present

## 2012-01-31 ENCOUNTER — Ambulatory Visit: Payer: Medicare Other | Admitting: Physical Therapy

## 2012-01-31 DIAGNOSIS — M6281 Muscle weakness (generalized): Secondary | ICD-10-CM | POA: Diagnosis not present

## 2012-01-31 DIAGNOSIS — IMO0001 Reserved for inherently not codable concepts without codable children: Secondary | ICD-10-CM | POA: Diagnosis not present

## 2012-01-31 DIAGNOSIS — R269 Unspecified abnormalities of gait and mobility: Secondary | ICD-10-CM | POA: Diagnosis not present

## 2012-01-31 DIAGNOSIS — R262 Difficulty in walking, not elsewhere classified: Secondary | ICD-10-CM | POA: Diagnosis not present

## 2012-02-13 ENCOUNTER — Other Ambulatory Visit: Payer: Self-pay | Admitting: Internal Medicine

## 2012-02-13 MED ORDER — ALPRAZOLAM 0.5 MG PO TABS: ORAL_TABLET | ORAL | Status: DC

## 2012-02-13 NOTE — Telephone Encounter (Signed)
#  90,5RF 

## 2012-02-13 NOTE — Telephone Encounter (Signed)
Refill done.  

## 2012-02-13 NOTE — Telephone Encounter (Signed)
Refill for Alprazolam 0.5MG  Tablet Take 1/2 tablet by mouth 3 times daily  Last written 10.1.2012, qty 90  Last OV 3.11.2013

## 2012-02-13 NOTE — Telephone Encounter (Signed)
Ok to refill 

## 2012-02-20 DIAGNOSIS — M949 Disorder of cartilage, unspecified: Secondary | ICD-10-CM | POA: Diagnosis not present

## 2012-02-20 DIAGNOSIS — M899 Disorder of bone, unspecified: Secondary | ICD-10-CM | POA: Diagnosis not present

## 2012-02-21 ENCOUNTER — Other Ambulatory Visit: Payer: Self-pay | Admitting: Internal Medicine

## 2012-02-21 MED ORDER — METOPROLOL TARTRATE 50 MG PO TABS: 100.0000 mg | ORAL_TABLET | Freq: Two times a day (BID) | ORAL | Status: DC

## 2012-02-21 NOTE — Telephone Encounter (Signed)
Refilled metoprolol. Spoke with pharmacy & fluoxetine 20mg  pt still has 5 refills remaining.

## 2012-02-21 NOTE — Telephone Encounter (Signed)
refill for Fluoxetine HCL 20MG  Capsule Requesting 90-day supply  Last written 4.8.13 qty 60 Take one capsule by mouth twice daily  Last OV 3.11.13 Future appt 9.16.13 35-month F/U  & Metoprolol Tarta 50MG  Tab Requesting 90-days Last written 3.14.13 qty 120 Take two tablets twice daily

## 2012-03-26 ENCOUNTER — Telehealth: Payer: Self-pay | Admitting: Internal Medicine

## 2012-03-26 NOTE — Telephone Encounter (Signed)
Discussed with pt

## 2012-03-26 NOTE — Telephone Encounter (Signed)
Please advise patient: Bone density test dated 02/20/2012 show a T score of -2.2, not significantly different compared to 2009. Plan: Stay active, take calcium and vitamin D. Taking Fosamax weekly is an option, she is a small frame person and more prone to falls and fractures. We could start Fosamax now or we can discuss further when she comes back. Next bone density test in 2 years

## 2012-04-01 ENCOUNTER — Encounter: Payer: Self-pay | Admitting: Internal Medicine

## 2012-04-25 ENCOUNTER — Other Ambulatory Visit: Payer: Self-pay | Admitting: Internal Medicine

## 2012-04-25 NOTE — Telephone Encounter (Signed)
Refill done.  

## 2012-06-23 ENCOUNTER — Other Ambulatory Visit: Payer: Self-pay | Admitting: Internal Medicine

## 2012-06-23 NOTE — Telephone Encounter (Signed)
Refill done.  

## 2012-06-30 ENCOUNTER — Encounter: Payer: Self-pay | Admitting: Internal Medicine

## 2012-06-30 ENCOUNTER — Ambulatory Visit (INDEPENDENT_AMBULATORY_CARE_PROVIDER_SITE_OTHER): Payer: Medicare Other | Admitting: Internal Medicine

## 2012-06-30 VITALS — BP 124/80 | HR 56 | Temp 97.5°F | Wt 110.0 lb

## 2012-06-30 DIAGNOSIS — M949 Disorder of cartilage, unspecified: Secondary | ICD-10-CM | POA: Diagnosis not present

## 2012-06-30 DIAGNOSIS — F411 Generalized anxiety disorder: Secondary | ICD-10-CM | POA: Diagnosis not present

## 2012-06-30 DIAGNOSIS — I10 Essential (primary) hypertension: Secondary | ICD-10-CM | POA: Diagnosis not present

## 2012-06-30 DIAGNOSIS — M899 Disorder of bone, unspecified: Secondary | ICD-10-CM | POA: Diagnosis not present

## 2012-06-30 MED ORDER — ALENDRONATE SODIUM 70 MG PO TABS: 70.0000 mg | ORAL_TABLET | ORAL | Status: DC

## 2012-06-30 NOTE — Assessment & Plan Note (Signed)
Stable, no change

## 2012-06-30 NOTE — Patient Instructions (Signed)
Takes Fosamax on an empty stomach once a week, stay upright for at least 40 minutes after you take the medication, call if you have chest pain or heartburn after you start the medication.

## 2012-06-30 NOTE — Assessment & Plan Note (Signed)
Well-controlled, no change 

## 2012-06-30 NOTE — Assessment & Plan Note (Addendum)
We discussed 02/2012 bone density test, it showed osteopenia. We talk about Fosamax and she decided to start therapy. How to use it and side effects discussed. Prescription sent Continue with calcium and vitamin D
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
## 2012-07-01 DIAGNOSIS — H2700 Aphakia, unspecified eye: Secondary | ICD-10-CM | POA: Diagnosis not present

## 2012-07-01 DIAGNOSIS — H33059 Total retinal detachment, unspecified eye: Secondary | ICD-10-CM | POA: Diagnosis not present

## 2012-07-01 DIAGNOSIS — H43819 Vitreous degeneration, unspecified eye: Secondary | ICD-10-CM | POA: Diagnosis not present

## 2012-07-01 DIAGNOSIS — H353190 Nonexudative age-related macular degeneration, unspecified eye, stage unspecified: Secondary | ICD-10-CM | POA: Diagnosis not present

## 2012-08-12 ENCOUNTER — Telehealth: Payer: Self-pay

## 2012-08-12 NOTE — Telephone Encounter (Signed)
Pt son wanted to speak to [facility] concerning pt meds due to pt hallucinating. I scheduled appt 08/15/12 1:45pm.     MW

## 2012-08-15 ENCOUNTER — Ambulatory Visit (INDEPENDENT_AMBULATORY_CARE_PROVIDER_SITE_OTHER): Payer: Medicare Other | Admitting: Internal Medicine

## 2012-08-15 DIAGNOSIS — F0390 Unspecified dementia without behavioral disturbance: Secondary | ICD-10-CM

## 2012-08-15 DIAGNOSIS — Z23 Encounter for immunization: Secondary | ICD-10-CM | POA: Diagnosis not present

## 2012-08-15 MED ORDER — FLUOXETINE HCL 10 MG PO CAPS: 10.0000 mg | ORAL_CAPSULE | Freq: Every day | ORAL | Status: DC

## 2012-08-15 NOTE — Progress Notes (Signed)
  Subjective:    Patient ID: Cheryl Armstrong, female    DOB: 03-13-1928, 78 y.o.   MRN: [id number]  HPI Today I have a conversation with Bethann Berkshire and Deniece Portela here at my office, the pt wasn't present, they are the only 2 children and the power of attorney for Sweetie (document scanned) The main concern is ~ 2  year history of gradual deterioration of her memory, symptoms are on and off. She also gets very confused at times. Often times thinks that her late husband is alive, or her sister Gigi Gin is still alive. She wanders, for instance recently she went to see one of her neighbors at 2:30 in the morning. She thought that [place] had a daughter, he doesn't.  She has not complained of any headaches, they have not noticed her to be depressed or anxious. She still drives.    Review of Systems     Objective:   Physical Exam        Assessment & Plan:

## 2012-08-16 ENCOUNTER — Other Ambulatory Visit: Payer: Self-pay | Admitting: Internal Medicine

## 2012-08-18 ENCOUNTER — Other Ambulatory Visit: Payer: Self-pay | Admitting: Internal Medicine

## 2012-08-18 DIAGNOSIS — F0390 Unspecified dementia without behavioral disturbance: Secondary | ICD-10-CM

## 2012-08-18 HISTORY — DX: Unspecified dementia, unspecified severity, without behavioral disturbance, psychotic disturbance, mood disturbance, and anxiety: F03.90

## 2012-08-18 MED ORDER — ALPRAZOLAM 0.5 MG PO TABS: ORAL_TABLET | ORAL | Status: DC

## 2012-08-18 NOTE — Telephone Encounter (Signed)
Done, change sig to 1/2 tid

## 2012-08-18 NOTE — Telephone Encounter (Signed)
Refill done.  

## 2012-08-18 NOTE — Assessment & Plan Note (Signed)
Dementia, Based on the information, the patient has dementia; pain gradual development of symptoms, I don't think brain imaging is necessary but that's a consideration. The  family is concerned about overusing medication, she started to fluoxetine after she lost her husband, as a trial , we agreed that we will decrease fluoxetine from 20 mg to 10 mg daily. Also, apparently she's taking Xanax to 4 times a day everyday instead of as needed. Recommend to gradually decrease Xanax maybe half tablet 3 times a day, then twice a day and finally  only at bedtime. Is important for them to have a conversation among themselves and with mother about what resources are available. She may need a Comptroller. Nursing home placement? ALF? I could  send a home health agency to see if she qualifies for a sitter. Will also talk about safety particularly driving and wandering. We agreed that she come back in 4 weeks for a checkup. F2F 33 min

## 2012-08-18 NOTE — Telephone Encounter (Signed)
pt is out and needs as soon as possible

## 2012-08-18 NOTE — Telephone Encounter (Signed)
Ok to refill 

## 2012-08-22 DIAGNOSIS — Z1231 Encounter for screening mammogram for malignant neoplasm of breast: Secondary | ICD-10-CM | POA: Diagnosis not present

## 2012-09-05 ENCOUNTER — Encounter: Payer: Self-pay | Admitting: Internal Medicine

## 2012-09-17 ENCOUNTER — Ambulatory Visit (INDEPENDENT_AMBULATORY_CARE_PROVIDER_SITE_OTHER): Payer: Medicare Other | Admitting: Internal Medicine

## 2012-09-17 ENCOUNTER — Encounter: Payer: Self-pay | Admitting: Internal Medicine

## 2012-09-17 VITALS — BP 108/70 | HR 58 | Temp 97.7°F | Wt 110.0 lb

## 2012-09-17 DIAGNOSIS — F0390 Unspecified dementia without behavioral disturbance: Secondary | ICD-10-CM

## 2012-09-17 DIAGNOSIS — F411 Generalized anxiety disorder: Secondary | ICD-10-CM

## 2012-09-17 MED ORDER — ALPRAZOLAM 0.5 MG PO TABS: ORAL_TABLET | ORAL | Status: DC

## 2012-09-17 NOTE — Assessment & Plan Note (Signed)
Fluoxetine dose decreased  from 20 to10 mg, Xanax does decrease as well, currently taking half tablet 3 times a day. She is improving mentally without a increase in anxiety. I am somehow hesitant to completely discontinue fluoxetine, in the past she has been very anxious. Plan: Decrease Xanax to half tablet twice a day Reassess in followup Consider continue decreasing medication doses.

## 2012-09-17 NOTE — Patient Instructions (Addendum)
Decrease alprazolam to only half tablet twice a day Think about Aricept and Namenda please come back in 2-3 months for a checkup.

## 2012-09-17 NOTE — Assessment & Plan Note (Signed)
The patient has mild dementia, since we decreased her anxiety medication she is actually better. I talked with the patient and her daughter-in-law about the benefits of Namenda, Aricept, also discussed side effects. Printed material about these drugs provided. they will call me when/if ready to start the medication.
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
## 2012-09-24 DIAGNOSIS — H00019 Hordeolum externum unspecified eye, unspecified eyelid: Secondary | ICD-10-CM | POA: Diagnosis not present

## 2012-10-13 DIAGNOSIS — H00019 Hordeolum externum unspecified eye, unspecified eyelid: Secondary | ICD-10-CM | POA: Diagnosis not present

## 2012-11-11 ENCOUNTER — Other Ambulatory Visit: Payer: Self-pay | Admitting: Internal Medicine

## 2012-11-11 NOTE — Telephone Encounter (Signed)
Refill done.  

## 2012-11-20 ENCOUNTER — Other Ambulatory Visit: Payer: Self-pay | Admitting: Internal Medicine

## 2012-11-21 NOTE — Telephone Encounter (Signed)
Ok to refill? Last OV 12.4.13 Last filled 12.4.13

## 2012-11-21 NOTE — Telephone Encounter (Signed)
Done

## 2012-12-29 ENCOUNTER — Encounter: Payer: Self-pay | Admitting: Lab

## 2012-12-30 ENCOUNTER — Ambulatory Visit: Payer: Medicare Other | Admitting: Internal Medicine

## 2013-01-03 ENCOUNTER — Other Ambulatory Visit: Payer: Self-pay | Admitting: Internal Medicine

## 2013-01-05 NOTE — Telephone Encounter (Signed)
Refill done.  

## 2013-01-08 DIAGNOSIS — H31019 Macula scars of posterior pole (postinflammatory) (post-traumatic), unspecified eye: Secondary | ICD-10-CM | POA: Diagnosis not present

## 2013-01-08 DIAGNOSIS — Z961 Presence of intraocular lens: Secondary | ICD-10-CM | POA: Diagnosis not present

## 2013-01-08 DIAGNOSIS — H3530 Unspecified macular degeneration: Secondary | ICD-10-CM | POA: Diagnosis not present

## 2013-01-08 DIAGNOSIS — H04129 Dry eye syndrome of unspecified lacrimal gland: Secondary | ICD-10-CM | POA: Diagnosis not present

## 2013-01-13 ENCOUNTER — Encounter: Payer: Self-pay | Admitting: Lab

## 2013-01-14 ENCOUNTER — Ambulatory Visit (INDEPENDENT_AMBULATORY_CARE_PROVIDER_SITE_OTHER): Payer: Medicare Other | Admitting: Internal Medicine

## 2013-01-14 ENCOUNTER — Encounter: Payer: Self-pay | Admitting: Internal Medicine

## 2013-01-14 VITALS — BP 124/84 | HR 52 | Temp 97.9°F | Ht 62.0 in | Wt 109.0 lb

## 2013-01-14 DIAGNOSIS — R7309 Other abnormal glucose: Secondary | ICD-10-CM

## 2013-01-14 DIAGNOSIS — I10 Essential (primary) hypertension: Secondary | ICD-10-CM

## 2013-01-14 DIAGNOSIS — Z0000 Encounter for general adult medical examination without abnormal findings: Secondary | ICD-10-CM

## 2013-01-14 DIAGNOSIS — F411 Generalized anxiety disorder: Secondary | ICD-10-CM | POA: Diagnosis not present

## 2013-01-14 DIAGNOSIS — M949 Disorder of cartilage, unspecified: Secondary | ICD-10-CM

## 2013-01-14 DIAGNOSIS — M899 Disorder of bone, unspecified: Secondary | ICD-10-CM | POA: Diagnosis not present

## 2013-01-14 DIAGNOSIS — R7303 Prediabetes: Secondary | ICD-10-CM

## 2013-01-14 DIAGNOSIS — Z13220 Encounter for screening for lipoid disorders: Secondary | ICD-10-CM

## 2013-01-14 LAB — CBC WITH DIFFERENTIAL/PLATELET
Basophils Absolute: 0 10*3/uL (ref 0.0–0.1)
Basophils Relative: 0.5 % (ref 0.0–3.0)
Eosinophils Absolute: 0.3 10*3/uL (ref 0.0–0.7)
HCT: 39.9 % (ref 36.0–46.0)
Hemoglobin: 13.4 g/dL (ref 12.0–15.0)
Lymphocytes Relative: 28.5 % (ref 12.0–46.0)
Lymphs Abs: 1.5 10*3/uL (ref 0.7–4.0)
MCHC: 33.5 g/dL (ref 30.0–36.0)
MCV: 87.7 fl (ref 78.0–100.0)
Neutro Abs: 2.8 10*3/uL (ref 1.4–7.7)
RBC: 4.55 Mil/uL (ref 3.87–5.11)
RDW: 13.9 % (ref 11.5–14.6)

## 2013-01-14 LAB — BASIC METABOLIC PANEL
BUN: 15 mg/dL (ref 6–23)
CO2: 29 mEq/L (ref 19–32)
Calcium: 9.5 mg/dL (ref 8.4–10.5)
Chloride: 103 mEq/L (ref 96–112)
Creatinine, Ser: 1 mg/dL (ref 0.4–1.2)
Glucose, Bld: 93 mg/dL (ref 70–99)

## 2013-01-14 LAB — HEMOGLOBIN A1C: Hgb A1c MFr Bld: 5.9 % (ref 4.6–6.5)

## 2013-01-14 NOTE — Assessment & Plan Note (Signed)
Doing well w/ fosamax, no change Next dexa 02-2014

## 2013-01-14 NOTE — Assessment & Plan Note (Signed)
Well-controlled, no change 

## 2013-01-14 NOTE — Assessment & Plan Note (Signed)
Well controlled 
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
## 2013-01-14 NOTE — Patient Instructions (Signed)

## 2013-01-14 NOTE — Assessment & Plan Note (Signed)
Has a healthy life style, labs

## 2013-01-14 NOTE — Assessment & Plan Note (Addendum)
Td -2004, 2010 Pneumovax 2004 and 2009  Got a shingles shot  Used to see Dr  Elana Alm , retired, h/o hysterectomy for benign resasons, never had an abnormal PAP "I don't think I need more PAPs", i agree, she is extremely low risk Mammogram 11-13 --->  normal , SBE normal  never Cscope, 4-13 IFOB was neg, another iFOB provided   Diet : doing great, eating a variety of foods exercise : doing great screening for AAA 09-2010 negative Cholesterol consistently ok, unable to recheck, no code

## 2013-01-19 ENCOUNTER — Telehealth: Payer: Self-pay

## 2013-01-19 NOTE — Telephone Encounter (Signed)
Discussed with pt, mailed pt another iFOB.

## 2013-01-19 NOTE — Telephone Encounter (Signed)
Patient was seen last Wednesday, was given IFOB kit and it was missing some items (the cover that goes over the toilet), patient was unable to complete IFOB over the weekend and would like a call to further discuss

## 2013-01-27 ENCOUNTER — Other Ambulatory Visit (INDEPENDENT_AMBULATORY_CARE_PROVIDER_SITE_OTHER): Payer: Medicare Other

## 2013-01-27 DIAGNOSIS — Z0000 Encounter for general adult medical examination without abnormal findings: Secondary | ICD-10-CM

## 2013-02-02 ENCOUNTER — Encounter: Payer: Self-pay | Admitting: *Deleted

## 2013-02-11 ENCOUNTER — Other Ambulatory Visit: Payer: Self-pay | Admitting: Internal Medicine

## 2013-02-11 NOTE — Telephone Encounter (Signed)
Refill done.  

## 2013-02-12 ENCOUNTER — Encounter: Payer: Self-pay | Admitting: Internal Medicine

## 2013-05-25 ENCOUNTER — Other Ambulatory Visit: Payer: Self-pay | Admitting: Internal Medicine

## 2013-05-25 NOTE — Telephone Encounter (Signed)
Refill done.  

## 2013-05-29 ENCOUNTER — Telehealth: Payer: Self-pay | Admitting: Internal Medicine

## 2013-05-29 ENCOUNTER — Other Ambulatory Visit: Payer: Self-pay | Admitting: Internal Medicine

## 2013-05-29 NOTE — Telephone Encounter (Signed)
RF done. .

## 2013-05-29 NOTE — Telephone Encounter (Signed)
This refill has already been processed today.

## 2013-05-29 NOTE — Telephone Encounter (Signed)
Ok to refill Alprazolam? Last OV 4.2.14 Last filled 2.6.14 #30 and 5 refills Contract on file, low risk. UDS current

## 2013-05-29 NOTE — Telephone Encounter (Signed)
rx faxed to pharmacy

## 2013-06-30 DIAGNOSIS — H43819 Vitreous degeneration, unspecified eye: Secondary | ICD-10-CM | POA: Diagnosis not present

## 2013-06-30 DIAGNOSIS — H353190 Nonexudative age-related macular degeneration, unspecified eye, stage unspecified: Secondary | ICD-10-CM | POA: Diagnosis not present

## 2013-06-30 DIAGNOSIS — H2700 Aphakia, unspecified eye: Secondary | ICD-10-CM | POA: Diagnosis not present

## 2013-07-13 ENCOUNTER — Other Ambulatory Visit: Payer: Self-pay | Admitting: Internal Medicine

## 2013-07-13 DIAGNOSIS — M8580 Other specified disorders of bone density and structure, unspecified site: Secondary | ICD-10-CM

## 2013-07-13 NOTE — Telephone Encounter (Signed)
Fosamax refill sent to [facility] [address]

## 2013-07-17 ENCOUNTER — Encounter: Payer: Self-pay | Admitting: Internal Medicine

## 2013-07-17 ENCOUNTER — Ambulatory Visit (INDEPENDENT_AMBULATORY_CARE_PROVIDER_SITE_OTHER): Payer: Medicare Other | Admitting: Internal Medicine

## 2013-07-17 VITALS — BP 209/70 | HR 57 | Temp 98.0°F | Wt 108.0 lb

## 2013-07-17 DIAGNOSIS — I10 Essential (primary) hypertension: Secondary | ICD-10-CM

## 2013-07-17 DIAGNOSIS — F411 Generalized anxiety disorder: Secondary | ICD-10-CM | POA: Diagnosis not present

## 2013-07-17 DIAGNOSIS — F0390 Unspecified dementia without behavioral disturbance: Secondary | ICD-10-CM

## 2013-07-17 DIAGNOSIS — Z23 Encounter for immunization: Secondary | ICD-10-CM

## 2013-07-17 LAB — URINALYSIS, ROUTINE W REFLEX MICROSCOPIC
Ketones, ur: NEGATIVE
Nitrite: NEGATIVE
Total Protein, Urine: NEGATIVE
Urobilinogen, UA: 0.2 (ref 0.0–1.0)
WBC, UA: NONE SEEN (ref 0–?)

## 2013-07-17 MED ORDER — AMLODIPINE BESYLATE 5 MG PO TABS: 5.0000 mg | ORAL_TABLET | Freq: Every day | ORAL | Status: DC

## 2013-07-17 MED ORDER — CITALOPRAM HYDROBROMIDE 10 MG PO TABS: 10.0000 mg | ORAL_TABLET | Freq: Every day | ORAL | Status: DC

## 2013-07-17 NOTE — Assessment & Plan Note (Addendum)
BP elevated today, good medication compliance, Plan: add Amlodipine 5 mg, watch for edema

## 2013-07-17 NOTE — Patient Instructions (Addendum)
Go to the lab and  leave a urine sample Next visit in 3 weeks for a check up Please consider talk with one of our counselors  Check the  blood pressure daily , be sure it is between 110/60 and 140/85. Call if in 5 days with readings If BP is consistently more than 190---> call us  If you develop  chest pain, headache,Nausea, dizziness----> go to the ER
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
## 2013-07-17 NOTE — Assessment & Plan Note (Addendum)
Anxiety not as well-controlled as it needs to be. Question of hallucinations with fluoxetine but could be natural dementia progression. Plan: Will switch to other SSRI, trial with citalopram Continue with Xanax. Currently taking one in the morning and one at night, recommend to decrease to half in the morning. Apparently she's   Tolerating benzos well Check a UA Strongly recommend a counseling RTC 3 weeks. Family to watch her closely

## 2013-07-17 NOTE — Assessment & Plan Note (Addendum)
We are changing her anxiety treatment today, Consider introduce another medication when she comes back, ?aricept- Namenda.

## 2013-07-18 ENCOUNTER — Encounter: Payer: Self-pay | Admitting: Internal Medicine

## 2013-07-21 DIAGNOSIS — Z79899 Other long term (current) drug therapy: Secondary | ICD-10-CM | POA: Diagnosis not present

## 2013-07-28 ENCOUNTER — Ambulatory Visit (INDEPENDENT_AMBULATORY_CARE_PROVIDER_SITE_OTHER): Payer: Medicare Other | Admitting: Licensed Clinical Social Worker

## 2013-07-28 DIAGNOSIS — F39 Unspecified mood [affective] disorder: Secondary | ICD-10-CM | POA: Diagnosis not present

## 2013-08-11 ENCOUNTER — Ambulatory Visit (INDEPENDENT_AMBULATORY_CARE_PROVIDER_SITE_OTHER): Payer: Medicare Other | Admitting: Internal Medicine

## 2013-08-11 ENCOUNTER — Encounter: Payer: Self-pay | Admitting: Internal Medicine

## 2013-08-11 VITALS — BP 187/65 | HR 57 | Temp 98.3°F | Wt 107.5 lb

## 2013-08-11 DIAGNOSIS — I10 Essential (primary) hypertension: Secondary | ICD-10-CM

## 2013-08-11 DIAGNOSIS — F411 Generalized anxiety disorder: Secondary | ICD-10-CM

## 2013-08-11 MED ORDER — CITALOPRAM HYDROBROMIDE 10 MG PO TABS: 10.0000 mg | ORAL_TABLET | Freq: Every day | ORAL | Status: DC

## 2013-08-11 MED ORDER — ALPRAZOLAM 0.5 MG PO TABS: 0.2500 mg | ORAL_TABLET | Freq: Two times a day (BID) | ORAL | Status: DC | PRN

## 2013-08-11 NOTE — Patient Instructions (Signed)
Next visit in  2 or 3 months for depression follow up (30 minutes). No Fasting Please make an appointment

## 2013-08-11 NOTE — Assessment & Plan Note (Signed)
Hypertension, ambulatory BPs are great, BP today elevated. Plan: No change

## 2013-08-11 NOTE — Assessment & Plan Note (Signed)
Overall seems to be doing well with a low dose of citalopram, encouraged to continue being active and engaged socially. She already saw the counselor one time, encouraged her to see her few more times at least. Next visit in 2- 3 months.
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
## 2013-08-13 ENCOUNTER — Ambulatory Visit (INDEPENDENT_AMBULATORY_CARE_PROVIDER_SITE_OTHER): Payer: Medicare Other | Admitting: Licensed Clinical Social Worker

## 2013-08-13 DIAGNOSIS — F411 Generalized anxiety disorder: Secondary | ICD-10-CM

## 2013-08-24 DIAGNOSIS — Z1231 Encounter for screening mammogram for malignant neoplasm of breast: Secondary | ICD-10-CM | POA: Diagnosis not present

## 2013-08-24 LAB — HM MAMMOGRAPHY: HM Mammogram: NEGATIVE

## 2013-08-25 ENCOUNTER — Encounter: Payer: Self-pay | Admitting: Internal Medicine

## 2013-08-27 ENCOUNTER — Ambulatory Visit (INDEPENDENT_AMBULATORY_CARE_PROVIDER_SITE_OTHER): Payer: Medicare Other | Admitting: Licensed Clinical Social Worker

## 2013-08-27 DIAGNOSIS — F411 Generalized anxiety disorder: Secondary | ICD-10-CM

## 2013-08-28 ENCOUNTER — Encounter: Payer: Self-pay | Admitting: Internal Medicine

## 2013-09-15 ENCOUNTER — Other Ambulatory Visit: Payer: Self-pay | Admitting: Internal Medicine

## 2013-09-16 NOTE — Telephone Encounter (Signed)
Metoprolol refilled per protocol 

## 2013-10-21 ENCOUNTER — Ambulatory Visit (INDEPENDENT_AMBULATORY_CARE_PROVIDER_SITE_OTHER): Payer: Medicare Other | Admitting: Internal Medicine

## 2013-10-21 ENCOUNTER — Encounter: Payer: Self-pay | Admitting: Internal Medicine

## 2013-10-21 VITALS — BP 171/70 | HR 60 | Temp 98.0°F | Wt 110.0 lb

## 2013-10-21 DIAGNOSIS — R5383 Other fatigue: Secondary | ICD-10-CM

## 2013-10-21 DIAGNOSIS — F411 Generalized anxiety disorder: Secondary | ICD-10-CM

## 2013-10-21 DIAGNOSIS — M949 Disorder of cartilage, unspecified: Secondary | ICD-10-CM

## 2013-10-21 DIAGNOSIS — I10 Essential (primary) hypertension: Secondary | ICD-10-CM

## 2013-10-21 DIAGNOSIS — R5381 Other malaise: Secondary | ICD-10-CM | POA: Diagnosis not present

## 2013-10-21 DIAGNOSIS — M899 Disorder of bone, unspecified: Secondary | ICD-10-CM

## 2013-10-21 NOTE — Assessment & Plan Note (Signed)
Good compliance with medications without apparent side effects, on calcium supplements, no change

## 2013-10-21 NOTE — Assessment & Plan Note (Signed)
BP slightly elevated today but at home never more than 140. Good compliance with medications without apparent side effects. Last BMP normal. Plan: Refill medicines when necessary, no change
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
## 2013-10-21 NOTE — Progress Notes (Signed)
Pre visit review using our clinic review tool, if applicable. No additional management support is needed unless otherwise documented below in the visit note. 

## 2013-10-21 NOTE — Assessment & Plan Note (Signed)
Currently well controlled on medications, reports actually that she is feeling great.

## 2013-10-21 NOTE — Assessment & Plan Note (Signed)
Not an issue today

## 2013-10-21 NOTE — Patient Instructions (Signed)
  Next visit is for a physical exam in 4 months  Come back fasting Please make an appointment

## 2013-11-16 ENCOUNTER — Telehealth: Payer: Self-pay | Admitting: Internal Medicine

## 2013-11-16 NOTE — Telephone Encounter (Signed)
Relevant patient education mailed to patient.  

## 2013-11-18 ENCOUNTER — Other Ambulatory Visit: Payer: Self-pay | Admitting: Internal Medicine

## 2013-11-18 NOTE — Telephone Encounter (Signed)
Amlodipine refilled per protocol. JG//CMA 

## 2013-12-15 ENCOUNTER — Other Ambulatory Visit: Payer: Self-pay | Admitting: Internal Medicine

## 2013-12-15 ENCOUNTER — Telehealth: Payer: Self-pay | Admitting: *Deleted

## 2013-12-15 MED ORDER — ALPRAZOLAM 0.5 MG PO TABS: 0.2500 mg | ORAL_TABLET | Freq: Two times a day (BID) | ORAL | Status: DC | PRN

## 2013-12-15 NOTE — Addendum Note (Signed)
Addended by: Colon Branch on: 12/15/2013 03:38 PM   Modules accepted: Orders

## 2013-12-15 NOTE — Telephone Encounter (Addendum)
rx printed, #30,  1 Rf Needs UDS at her convenience

## 2013-12-15 NOTE — Telephone Encounter (Signed)
rx refill- xanax 0.5mg  Last OV- 10/21/13 Last refilled- 08/11/13 #30 / 3 rf  Last UDS- 07/17/13 MOD risk

## 2013-12-16 NOTE — Telephone Encounter (Signed)
rx [facility]ed to [facility] [address] . Pt notified also to provide UDS at her convenience.

## 2014-01-11 DIAGNOSIS — Z961 Presence of intraocular lens: Secondary | ICD-10-CM | POA: Diagnosis not present

## 2014-01-11 DIAGNOSIS — H04129 Dry eye syndrome of unspecified lacrimal gland: Secondary | ICD-10-CM | POA: Diagnosis not present

## 2014-01-11 DIAGNOSIS — H4720 Unspecified optic atrophy: Secondary | ICD-10-CM | POA: Diagnosis not present

## 2014-01-11 DIAGNOSIS — H31019 Macula scars of posterior pole (postinflammatory) (post-traumatic), unspecified eye: Secondary | ICD-10-CM | POA: Diagnosis not present

## 2014-02-12 ENCOUNTER — Telehealth: Payer: Self-pay | Admitting: *Deleted

## 2014-02-12 ENCOUNTER — Other Ambulatory Visit: Payer: Self-pay | Admitting: Internal Medicine

## 2014-02-12 MED ORDER — ALPRAZOLAM 0.5 MG PO TABS: 0.2500 mg | ORAL_TABLET | Freq: Two times a day (BID) | ORAL | Status: DC | PRN

## 2014-02-12 NOTE — Telephone Encounter (Signed)
rx faxed to [facility] [address] .  

## 2014-02-12 NOTE — Telephone Encounter (Signed)
Xanax 0.5mg   Last OV- 10/21/13 Last refilled - #30 / 1 rf 12/15/13 UDS- 07/17/13 moderate risk

## 2014-02-12 NOTE — Telephone Encounter (Signed)
Ok RF  UDS due

## 2014-02-12 NOTE — Telephone Encounter (Signed)
Pts son notified. Will try to come by next week for UDS.

## 2014-02-17 ENCOUNTER — Telehealth: Payer: Self-pay | Admitting: *Deleted

## 2014-02-17 NOTE — Telephone Encounter (Signed)
  Medication List and Allergies: Reviewed and no changes 90 Day Supply/Mail Order: Prefers 60 Days supply [facility] [address] [facility] [place]  Immunizations Due: None  A/P: FH, PSH, or Personal History: Reviewed, no changes Flu Vaccine: 07/2013 Td:  06/2003 PNA:  05/2008 Shingles:  12/2011 CCS: iFob 01/2013 neg MMG: 08/2013 BI-RADS neg  BD: 02/2012 T-Score -2.2, repeat in 2 years

## 2014-02-18 ENCOUNTER — Encounter: Payer: Self-pay | Admitting: Internal Medicine

## 2014-02-18 ENCOUNTER — Ambulatory Visit (INDEPENDENT_AMBULATORY_CARE_PROVIDER_SITE_OTHER): Payer: Medicare Other | Admitting: Internal Medicine

## 2014-02-18 VITALS — BP 149/53 | HR 49 | Temp 97.6°F | Ht 61.75 in | Wt 107.4 lb

## 2014-02-18 DIAGNOSIS — I10 Essential (primary) hypertension: Secondary | ICD-10-CM

## 2014-02-18 DIAGNOSIS — Z780 Asymptomatic menopausal state: Secondary | ICD-10-CM

## 2014-02-18 DIAGNOSIS — Z0000 Encounter for general adult medical examination without abnormal findings: Secondary | ICD-10-CM | POA: Diagnosis not present

## 2014-02-18 DIAGNOSIS — R7303 Prediabetes: Secondary | ICD-10-CM

## 2014-02-18 DIAGNOSIS — F0390 Unspecified dementia without behavioral disturbance: Secondary | ICD-10-CM | POA: Diagnosis not present

## 2014-02-18 DIAGNOSIS — M899 Disorder of bone, unspecified: Secondary | ICD-10-CM | POA: Diagnosis not present

## 2014-02-18 DIAGNOSIS — Z23 Encounter for immunization: Secondary | ICD-10-CM

## 2014-02-18 DIAGNOSIS — M949 Disorder of cartilage, unspecified: Secondary | ICD-10-CM | POA: Diagnosis not present

## 2014-02-18 DIAGNOSIS — R7309 Other abnormal glucose: Secondary | ICD-10-CM

## 2014-02-18 LAB — COMPREHENSIVE METABOLIC PANEL
ALK PHOS: 52 U/L (ref 39–117)
ALT: 16 U/L (ref 0–35)
AST: 30 U/L (ref 0–37)
Albumin: 4.3 g/dL (ref 3.5–5.2)
BUN: 16 mg/dL (ref 6–23)
CO2: 31 mEq/L (ref 19–32)
Calcium: 9.5 mg/dL (ref 8.4–10.5)
Chloride: 102 mEq/L (ref 96–112)
Creatinine, Ser: 0.9 mg/dL (ref 0.4–1.2)
GFR: 60.29 mL/min (ref >60.00)
Glucose, Bld: 91 mg/dL (ref 70–99)
Potassium: 4.3 mEq/L (ref 3.5–5.1)
SODIUM: 139 meq/L (ref 135–145)
Total Bilirubin: 0.7 mg/dL (ref 0.2–1.2)
Total Protein: 7.5 g/dL (ref 6.0–8.3)

## 2014-02-18 LAB — TSH: TSH: 1.28 u[IU]/mL (ref 0.35–4.50)

## 2014-02-18 LAB — HEMOGLOBIN A1C: Hgb A1c MFr Bld: 6 % (ref 4.6–6.5)

## 2014-02-18 LAB — LIPID PANEL
Cholesterol: 156 mg/dL (ref 0–200)
HDL: 58.1 mg/dL (ref >39.00)
LDL CALC: 90 mg/dL (ref 0–99)
Total CHOL/HDL Ratio: 3
Triglycerides: 41 mg/dL (ref 0.0–149.0)
VLDL: 8.2 mg/dL (ref 0.0–40.0)

## 2014-02-18 MED ORDER — ALPRAZOLAM 0.5 MG PO TABS: 0.2500 mg | ORAL_TABLET | Freq: Two times a day (BID) | ORAL | Status: DC | PRN

## 2014-02-18 NOTE — Assessment & Plan Note (Signed)
Labs

## 2014-02-18 NOTE — Assessment & Plan Note (Addendum)
Td -  2010 Pneumovax 2004 and 2009  prevnar today Got a shingles shot 12-2011  No further PAPs, see note from 2014   MMG: 08/2013 BI-RADS neg , discussed d/c screening , pt in agreement  never Cscope, 4-13 and 01-2013 IFOBs were neg -- discussed d/c screening  , pt in agreement screening for AAA 09-2010 negative

## 2014-02-18 NOTE — Patient Instructions (Signed)
Get your blood work before you leave     Next visit is for routine check up in 2 months  No need to come back fasting Please make an appointment       Fall Prevention and [place] cause injuries and can affect all age groups. It is possible to use preventive measures to significantly decrease the likelihood of falls. There are many simple measures which can make your home safer and prevent falls. OUTDOORS  Repair cracks and edges of walkways and driveways.  Remove high doorway thresholds.  Trim shrubbery on the main path into your home.  Have good outside lighting.  Clear walkways of tools, rocks, debris, and clutter.  Check that handrails are not broken and are securely fastened. Both sides of steps should have handrails.  Have leaves, snow, and ice cleared regularly.  Use sand or salt on walkways during winter months.  In the garage, clean up grease or oil spills. BATHROOM  Install night lights.  Install grab bars by the toilet and in the tub and shower.  Use non-skid mats or decals in the tub or shower.  Place a plastic non-slip stool in the shower to sit on, if needed.  Keep floors dry and clean up all water on the floor immediately.  Remove soap buildup in the tub or shower on a regular basis.  Secure bath mats with non-slip, double-sided rug tape.  Remove throw rugs and tripping hazards from the floors. BEDROOMS  Install night lights.  Make sure a bedside light is easy to reach.  Do not use oversized bedding.  Keep a telephone by your bedside.  Have a firm chair with side arms to use for getting dressed.  Remove throw rugs and tripping hazards from the floor. KITCHEN  Keep handles on pots and pans turned toward the center of the stove. Use back burners when possible.  Clean up spills quickly and allow time for drying.  Avoid walking on wet floors.  Avoid hot utensils and knives.  Position shelves so they are not too high or  low.  Place commonly used objects within easy reach.  If necessary, use a sturdy step stool with a grab bar when reaching.  Keep electrical cables out of the way.  Do not use floor polish or wax that makes floors slippery. If you must use wax, use non-skid floor wax.  Remove throw rugs and tripping hazards from the floor. STAIRWAYS  Never leave objects on stairs.  Place handrails on both sides of stairways and use them. Fix any loose handrails. Make sure handrails on both sides of the stairways are as long as the stairs.  Check carpeting to make sure it is firmly attached along stairs. Make repairs to worn or loose carpet promptly.  Avoid placing throw rugs at the top or bottom of stairways, or properly secure the rug with carpet tape to prevent slippage. Get rid of throw rugs, if possible.  Have an electrician put in a light switch at the top and bottom of the stairs. OTHER FALL PREVENTION TIPS  Wear low-heel or rubber-soled shoes that are supportive and fit well. Wear closed toe shoes.  When using a stepladder, make sure it is fully opened and both spreaders are firmly locked. Do not climb a closed stepladder.  Add color or contrast paint or tape to grab bars and handrails in your home. Place contrasting color strips on first and last steps.  Learn and use mobility aids as needed. Install  an electrical emergency response system.  Turn on lights to avoid dark areas. Replace light bulbs that burn out immediately. Get light switches that glow.  Arrange furniture to create clear pathways. Keep furniture in the same place.  Firmly attach carpet with non-skid or double-sided tape.  Eliminate uneven floor surfaces.  Select a carpet pattern that does not visually hide the edge of steps.  Be aware of all pets. OTHER HOME SAFETY TIPS  Set the water temperature for 120 F (48.8 C).  Keep emergency numbers on or near the telephone.  Keep smoke detectors on every level of the  home and near sleeping areas. Document Released: 09/21/2002 Document Revised: 04/01/2012 Document Reviewed: 12/21/2011 [facility] Patient Information 2014 [place].
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
## 2014-02-18 NOTE — Assessment & Plan Note (Addendum)
Her daughter in law reports ongoing memory issues, has gotten lost few times, I asked her to come back in 2 months for a more formal mental exam. I am also quite concerned about the patient driving, Alieyah knows my opinion, she should not be driving. A advised Anne Ng to continue working in the issue, the family is also concerned, pt does not like to quit driving, we'll need to discuss more in 2 months

## 2014-02-18 NOTE — Assessment & Plan Note (Signed)
Last vit D normal BD: 02/2012 T-Score -2.2, repeat in 2 years Plan-- dexa

## 2014-02-18 NOTE — Progress Notes (Signed)
Pre visit review using our clinic review tool, if applicable. No additional management support is needed unless otherwise documented below in the visit note. 

## 2014-02-18 NOTE — Assessment & Plan Note (Signed)
BP at home under excellent control per patient, labs, no change

## 2014-02-19 ENCOUNTER — Telehealth: Payer: Self-pay | Admitting: Internal Medicine

## 2014-02-19 DIAGNOSIS — Z79899 Other long term (current) drug therapy: Secondary | ICD-10-CM | POA: Diagnosis not present

## 2014-02-19 NOTE — Telephone Encounter (Signed)
Relevant patient education assigned to patient using Emmi. ° °

## 2014-02-24 ENCOUNTER — Inpatient Hospital Stay: Admission: RE | Admit: 2014-02-24 | Payer: Medicare Other | Source: Ambulatory Visit

## 2014-02-26 ENCOUNTER — Telehealth: Payer: Self-pay

## 2014-02-26 NOTE — Telephone Encounter (Signed)
UDS: 02/19/2014 Negative for alprazolam Per Dr Larose Kells, moderate risk

## 2014-03-03 ENCOUNTER — Ambulatory Visit (INDEPENDENT_AMBULATORY_CARE_PROVIDER_SITE_OTHER)
Admission: RE | Admit: 2014-03-03 | Discharge: 2014-03-03 | Disposition: A | Discharge status: Home / Self Care | Payer: Medicare Other | Source: Ambulatory Visit | Attending: Internal Medicine | Admitting: Internal Medicine

## 2014-03-03 DIAGNOSIS — Z780 Asymptomatic menopausal state: Secondary | ICD-10-CM | POA: Diagnosis not present

## 2014-03-03 DIAGNOSIS — M899 Disorder of bone, unspecified: Secondary | ICD-10-CM

## 2014-03-03 DIAGNOSIS — M949 Disorder of cartilage, unspecified: Principal | ICD-10-CM

## 2014-03-09 ENCOUNTER — Telehealth: Payer: Self-pay | Admitting: Internal Medicine

## 2014-03-09 ENCOUNTER — Other Ambulatory Visit: Payer: Self-pay | Admitting: Internal Medicine

## 2014-03-09 NOTE — Telephone Encounter (Signed)
Done . Pt notified.  

## 2014-03-09 NOTE — Telephone Encounter (Signed)
Bone density test 03/03/2014 showed osteopenia we a T score of -1.8. previous results 02/2012 T-Score -2.2. Advise patient, bone density test was great, continue with Fosamax

## 2014-03-17 ENCOUNTER — Telehealth: Payer: Self-pay | Admitting: Internal Medicine

## 2014-03-17 ENCOUNTER — Encounter: Payer: Self-pay | Admitting: Internal Medicine

## 2014-03-20 ENCOUNTER — Other Ambulatory Visit: Payer: Self-pay | Admitting: Internal Medicine

## 2014-04-15 ENCOUNTER — Other Ambulatory Visit: Payer: Self-pay | Admitting: Internal Medicine

## 2014-04-21 ENCOUNTER — Encounter: Payer: Self-pay | Admitting: Internal Medicine

## 2014-04-21 ENCOUNTER — Ambulatory Visit (INDEPENDENT_AMBULATORY_CARE_PROVIDER_SITE_OTHER): Payer: Medicare Other | Admitting: Internal Medicine

## 2014-04-21 VITALS — BP 157/64 | HR 61 | Temp 97.8°F | Wt 107.0 lb

## 2014-04-21 DIAGNOSIS — F0391 Unspecified dementia with behavioral disturbance: Secondary | ICD-10-CM

## 2014-04-21 DIAGNOSIS — F03918 Unspecified dementia, unspecified severity, with other behavioral disturbance: Secondary | ICD-10-CM

## 2014-04-21 NOTE — Progress Notes (Signed)
Pre visit review using our clinic review tool, if applicable. No additional management support is needed unless otherwise documented below in the visit note. 

## 2014-04-21 NOTE — Assessment & Plan Note (Addendum)
Focus of the  visit was dementia, MMSE scored 18, moderate dementia. We talk about treatment options including Aricept and Namenda, pro-cons discussed. Family report she has sometimes difficulty sleeping and at night and  has hallucinations. The medications have the potential to worsening those sx . Based on the information we (pt,family and me)  decided   not to start medications. We also had a conversation about driving and I told her clearly she does not need to drive, risk of injure other and self discussed
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
## 2014-04-21 NOTE — Patient Instructions (Signed)
Next visit is for routine check up in 6 months  No need to come back fasting Please make an appointment

## 2014-05-11 ENCOUNTER — Telehealth: Payer: Self-pay | Admitting: Internal Medicine

## 2014-05-11 ENCOUNTER — Other Ambulatory Visit: Payer: Self-pay | Admitting: Internal Medicine

## 2014-05-11 NOTE — Telephone Encounter (Signed)
Caller name: Ayari Relation to pt: Call back number:[phone]   Reason for call:  Pt had questions about medications

## 2014-05-12 NOTE — Telephone Encounter (Signed)
Spoke with pt, pt states that she needs her zoster and prevnar shot. Advised pt that her prevnar shot was given here by nurse Gaye on 02/18/14 and her zoster was given on 12/26/11 at [place]. Pt states does not remember getting shot. Still requesting to have done. Please advise.Marland KitchenMarland KitchenMarland Kitchen

## 2014-05-12 NOTE — Telephone Encounter (Signed)
Immunization record was again reviewed with patient.  Patient stated understanding and no further needs were voiced.

## 2014-05-18 ENCOUNTER — Other Ambulatory Visit: Payer: Self-pay | Admitting: Internal Medicine

## 2014-05-18 ENCOUNTER — Telehealth: Payer: Self-pay | Admitting: *Deleted

## 2014-05-18 DIAGNOSIS — Z79899 Other long term (current) drug therapy: Secondary | ICD-10-CM | POA: Diagnosis not present

## 2014-05-18 MED ORDER — ALPRAZOLAM 0.5 MG PO TABS: 0.2500 mg | ORAL_TABLET | Freq: Two times a day (BID) | ORAL | Status: DC | PRN

## 2014-05-18 NOTE — Telephone Encounter (Signed)
rx faxed to [facility] . Pt advised of UDS . Pt scheduled tomorrow

## 2014-05-18 NOTE — Telephone Encounter (Signed)
rx refill - xanax 0.5mg  Last OV- 04/21/14 Last refilled - 03/10/14 # 60 / 0 rf  UDS- 02/19/14 moderate risk.

## 2014-05-18 NOTE — Telephone Encounter (Signed)
Done , due for a UDS w/ next RF

## 2014-05-19 ENCOUNTER — Other Ambulatory Visit: Payer: Medicare Other

## 2014-05-20 ENCOUNTER — Telehealth: Payer: Self-pay | Admitting: Internal Medicine

## 2014-05-20 NOTE — Telephone Encounter (Signed)
Caller name: [name] Relation to pt: Call back [phone]   Reason for call:   Wants status US UDS from 8/5. Thanks.

## 2014-06-01 ENCOUNTER — Telehealth: Payer: Self-pay | Admitting: Internal Medicine

## 2014-06-01 NOTE — Telephone Encounter (Signed)
Waiting on Dr. Ethel Rana signature.

## 2014-06-01 NOTE — Telephone Encounter (Signed)
Advise patient, urine test, UDS is very good, low risk

## 2014-06-02 NOTE — Telephone Encounter (Signed)
Spoke with Pt and gave her results of UDS.

## 2014-06-02 NOTE — Telephone Encounter (Signed)
Spoke with Pt and gave her UDS results.

## 2014-06-07 ENCOUNTER — Telehealth: Payer: Self-pay

## 2014-06-07 NOTE — Telephone Encounter (Signed)
Pts son came into office today requesting Pt's last Urinalysis results and wanted to speak with Dr. Ethel Rana nurse. I have called Pts son back and LMOM for him to return my telephone call about his mother.

## 2014-06-14 ENCOUNTER — Emergency Department (HOSPITAL_COMMUNITY): Payer: Medicare Other

## 2014-06-14 ENCOUNTER — Telehealth: Payer: Self-pay | Admitting: Internal Medicine

## 2014-06-14 ENCOUNTER — Emergency Department (HOSPITAL_COMMUNITY)
Admission: EM | Admit: 2014-06-14 | Discharge: 2014-06-14 | Disposition: A | Discharge status: Home / Self Care | Payer: Medicare Other | Attending: Emergency Medicine | Admitting: Emergency Medicine

## 2014-06-14 ENCOUNTER — Encounter (HOSPITAL_COMMUNITY): Payer: Self-pay | Admitting: Emergency Medicine

## 2014-06-14 DIAGNOSIS — M899 Disorder of bone, unspecified: Secondary | ICD-10-CM | POA: Insufficient documentation

## 2014-06-14 DIAGNOSIS — M949 Disorder of cartilage, unspecified: Secondary | ICD-10-CM

## 2014-06-14 DIAGNOSIS — F411 Generalized anxiety disorder: Secondary | ICD-10-CM | POA: Diagnosis not present

## 2014-06-14 DIAGNOSIS — I10 Essential (primary) hypertension: Secondary | ICD-10-CM | POA: Diagnosis not present

## 2014-06-14 DIAGNOSIS — F29 Unspecified psychosis not due to a substance or known physiological condition: Secondary | ICD-10-CM | POA: Insufficient documentation

## 2014-06-14 DIAGNOSIS — F03918 Unspecified dementia, unspecified severity, with other behavioral disturbance: Secondary | ICD-10-CM | POA: Diagnosis not present

## 2014-06-14 DIAGNOSIS — Z8669 Personal history of other diseases of the nervous system and sense organs: Secondary | ICD-10-CM | POA: Diagnosis not present

## 2014-06-14 DIAGNOSIS — R443 Hallucinations, unspecified: Secondary | ICD-10-CM | POA: Insufficient documentation

## 2014-06-14 DIAGNOSIS — R410 Disorientation, unspecified: Secondary | ICD-10-CM

## 2014-06-14 DIAGNOSIS — F0391 Unspecified dementia with behavioral disturbance: Secondary | ICD-10-CM

## 2014-06-14 DIAGNOSIS — Z79899 Other long term (current) drug therapy: Secondary | ICD-10-CM | POA: Insufficient documentation

## 2014-06-14 DIAGNOSIS — F0390 Unspecified dementia without behavioral disturbance: Secondary | ICD-10-CM | POA: Diagnosis not present

## 2014-06-14 DIAGNOSIS — Z87828 Personal history of other (healed) physical injury and trauma: Secondary | ICD-10-CM | POA: Insufficient documentation

## 2014-06-14 LAB — RAPID URINE DRUG SCREEN, HOSP PERFORMED
Amphetamines: NOT DETECTED
Barbiturates: NOT DETECTED
Benzodiazepines: POSITIVE — AB
Cocaine: NOT DETECTED
Opiates: NOT DETECTED
Tetrahydrocannabinol: NOT DETECTED

## 2014-06-14 LAB — COMPREHENSIVE METABOLIC PANEL
ALBUMIN: 4.2 g/dL (ref 3.5–5.2)
ALK PHOS: 61 U/L (ref 39–117)
ALT: 14 U/L (ref 0–35)
ANION GAP: 13 (ref 5–15)
AST: 29 U/L (ref 0–37)
BILIRUBIN TOTAL: 0.4 mg/dL (ref 0.3–1.2)
BUN: 17 mg/dL (ref 6–23)
CHLORIDE: 104 meq/L (ref 96–112)
CO2: 26 meq/L (ref 19–32)
Calcium: 10.3 mg/dL (ref 8.4–10.5)
Creatinine, Ser: 0.78 mg/dL (ref 0.50–1.10)
GFR calc Af Amer: 87 mL/min — ABNORMAL LOW (ref >90)
GFR, EST NON AFRICAN AMERICAN: 75 mL/min — AB (ref >90)
GLUCOSE: 95 mg/dL (ref 70–99)
POTASSIUM: 4.5 meq/L (ref 3.7–5.3)
Sodium: 143 mEq/L (ref 137–147)
Total Protein: 7.9 g/dL (ref 6.0–8.3)

## 2014-06-14 LAB — CBC WITH DIFFERENTIAL/PLATELET
BASOS PCT: 0 % (ref 0–1)
Basophils Absolute: 0 10*3/uL (ref 0.0–0.1)
Eosinophils Absolute: 0.3 10*3/uL (ref 0.0–0.7)
Eosinophils Relative: 4 % (ref 0–5)
HEMATOCRIT: 41.3 % (ref 36.0–46.0)
Hemoglobin: 13.7 g/dL (ref 12.0–15.0)
LYMPHS ABS: 1.9 10*3/uL (ref 0.7–4.0)
LYMPHS PCT: 27 % (ref 12–46)
MCH: 28.8 pg (ref 26.0–34.0)
MCHC: 33.2 g/dL (ref 30.0–36.0)
MCV: 86.9 fL (ref 78.0–100.0)
MONO ABS: 0.6 10*3/uL (ref 0.1–1.0)
MONOS PCT: 9 % (ref 3–12)
NEUTROS ABS: 4 10*3/uL (ref 1.7–7.7)
NEUTROS PCT: 60 % (ref 43–77)
Platelets: 186 10*3/uL (ref 150–400)
RBC: 4.75 MIL/uL (ref 3.87–5.11)
RDW: 13.3 % (ref 11.5–15.5)
WBC: 6.8 10*3/uL (ref 4.0–10.5)

## 2014-06-14 LAB — ETHANOL

## 2014-06-14 NOTE — Telephone Encounter (Signed)
Spoke with Dr. Larose Kells regarding patient's symptoms.  He advised that patient go to the ER.  The same was relayed to the son, [name].  He stated understanding and agreed with plan.  He stated [facility] and would be taking her there.  No further questions or concerns voiced.

## 2014-06-14 NOTE — Telephone Encounter (Signed)
Pt is currently in the ER.  Pt has been triaged and is currently being treated.  No additional information needed by the nurse.

## 2014-06-14 NOTE — ED Notes (Signed)
Family at bedside. 
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
## 2014-06-14 NOTE — Telephone Encounter (Signed)
Pt Caller name: Erline Levine  Relation to pt: attending nurse at [facility]  Call back number: Elvina Sidle [phone]    Reason for call:   As per [name] (patient son) was advised to call and ask Dr. Larose Kells nurse to please call the  attending Nurse at [facility] ER regarding what test needs to be run on pt.
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
## 2014-06-14 NOTE — ED Notes (Signed)
Patient transported to MRI 
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
## 2014-06-14 NOTE — Discharge Instructions (Signed)
Confusion Confusion is the inability to think with your usual speed or clarity. Confusion may come on quickly or slowly over time. How quickly the confusion comes on depends on the cause. Confusion can be due to any number of causes. CAUSES   Concussion, head injury, or head trauma.  Seizures.  Stroke.  Fever.  Brain tumor.  Age related decreased brain function (dementia).  Heightened emotional states like rage or terror.  Mental illness in which the person loses the ability to determine what is real and what is not (hallucinations).  Infections such as a urinary tract infection (UTI).  Toxic effects from alcohol, drugs, or prescription medicines.  Dehydration and an imbalance of salts in the body (electrolytes).  Lack of sleep.  Low blood sugar (diabetes).  Low levels of oxygen from conditions such as chronic lung disorders.  Drug interactions or other medicine side effects.  Nutritional deficiencies, especially niacin, thiamine, vitamin C, or vitamin B.  Sudden drop in body temperature (hypothermia).  Change in routine, such as when traveling or hospitalized. SIGNS AND SYMPTOMS  People often describe their thinking as cloudy or unclear when they are confused. Confusion can also include feeling disoriented. That means you are unaware of where or who you are. You may also not know what the date or time is. If confused, you may also have difficulty paying attention, remembering, and making decisions. Some people also act aggressively when they are confused.  DIAGNOSIS  The medical evaluation of confusion may include:  Blood and urine tests.  X-rays.  Brain and nervous system tests.  Analyzing your brain waves (electroencephalogram or EEG).  Magnetic resonance imaging (MRI) of your head.  Computed tomography (CT) scan of your head.  Mental status tests in which your health care provider may ask many questions. Some of these questions may seem silly or strange,  but they are a very important test to help diagnose and treat confusion. TREATMENT  An admission to the hospital may not be needed, but a person with confusion should not be left alone. Stay with a family member or friend until the confusion clears. Avoid alcohol, pain relievers, or sedative drugs until you have fully recovered. Do not drive until directed by your health care provider. HOME CARE INSTRUCTIONS  What family and friends can do:  To find out if someone is confused, ask the person to state his or her name, age, and the date. If the person is unsure or answers incorrectly, he or she is confused.  Always introduce yourself, no matter how well the person knows you.  Often remind the person of his or her location.  Place a calendar and clock near the confused person.  Help the person with his or her medicines. You may want to use a pill box, an alarm as a reminder, or give the person each dose as prescribed.  Talk about current events and plans for the day.  Try to keep the environment calm, quiet, and peaceful.  Make sure the person keeps follow-up visits with his or her health care provider. PREVENTION  Ways to prevent confusion:  Avoid alcohol.  Eat a balanced diet.  Get enough sleep.  Take medicine only as directed by your health care provider.  Do not become isolated. Spend time with other people and make plans for your days.  Keep careful watch on your blood sugar levels if you are diabetic. SEEK IMMEDIATE MEDICAL CARE IF:   You develop severe headaches, repeated vomiting, seizures, blackouts, or  slurred speech.  There is increasing confusion, weakness, numbness, restlessness, or personality changes.  You develop a loss of balance, have marked dizziness, feel uncoordinated, or fall.  You have delusions, hallucinations, or develop severe anxiety.  Your family members think you need to be rechecked. Document Released: 11/08/2004 Document Revised: 02/15/2014  Document Reviewed: 11/06/2013 [facility] Patient Information 2015 [place], [place]. This information is not intended to replace advice given to you by your health care provider. Make sure you discuss any questions you have with your health care provider.  Dementia Dementia is a general term for problems with brain function. A person with dementia has memory loss and a hard time with at least one other brain function such as thinking, speaking, or problem solving. Dementia can affect social functioning, how you do your job, your mood, or your personality. The changes may be hidden for a long time. The earliest forms of this disease are usually not detected by family or friends. Dementia can be:  Irreversible.  Potentially reversible.  Partially reversible.  Progressive. This means it can get worse over time. CAUSES  Irreversible dementia causes may include:  Degeneration of brain cells (Alzheimer disease or Lewy body dementia).  Multiple small strokes (vascular dementia).  Infection (chronic meningitis or Creutzfeldt-Jakob disease).  Frontotemporal dementia. This affects younger people, age 45 to 76, compared to those who have Alzheimer disease.  Dementia associated with other disorders like Parkinson disease, Huntington disease, or HIV-associated dementia. Potentially or partially reversible dementia causes may include:  Medicines.  Metabolic causes such as excessive alcohol intake, vitamin B12 deficiency, or thyroid disease.  Masses or pressure in the brain such as a tumor, blood clot, or hydrocephalus. SIGNS AND SYMPTOMS  Symptoms are often hard to detect. Family members or coworkers may not notice them early in the disease process. Different people with dementia may have different symptoms. Symptoms can include:  A hard time with memory, especially recent memory. Long-term memory may not be impaired.  Asking the same question multiple times or forgetting something someone just  said.  A hard time speaking your thoughts or finding certain words.  A hard time solving problems or performing familiar tasks (such as how to use a telephone).  Sudden changes in mood.  Changes in personality, especially increasing moodiness or mistrust.  Depression.  A hard time understanding complex ideas that were never a problem in the past. DIAGNOSIS  There are no specific tests for dementia.   Your health care provider may recommend a thorough evaluation. This is because some forms of dementia can be reversible. The evaluation will likely include a physical exam and getting a detailed history from you and a family member. The history often gives the best clues and suggestions for a diagnosis.  Memory testing may be done. A detailed brain function evaluation called neuropsychologic testing may be helpful.  Lab tests and brain imaging (such as a CT scan or MRI scan) are sometimes important.  Sometimes observation and re-evaluation over time is very helpful. TREATMENT  Treatment depends on the cause.   If the problem is a vitamin deficiency, it may be helped or cured with supplements.  For dementias such as Alzheimer disease, medicines are available to stabilize or slow the course of the disease. There are no cures for this type of dementia.  Your health care provider can help direct you to groups, organizations, and other health care providers to help with decisions in the care of you or your loved one. HOME CARE  INSTRUCTIONS The care of individuals with dementia is varied and dependent upon the progression of the dementia. The following suggestions are intended for the person living with, or caring for, the person with dementia.  Create a safe environment.  Remove the locks on bathroom doors to prevent the person from accidentally locking himself or herself in.  Use childproof latches on kitchen cabinets and any place where cleaning supplies, chemicals, or alcohol are  kept.  Use childproof covers in unused electrical outlets.  Install childproof devices to keep doors and windows secured.  Remove stove knobs or install safety knobs and an automatic shut-off on the stove.  Lower the temperature on water heaters.  Label medicines and keep them locked up.  Secure knives, lighters, matches, power tools, and guns, and keep these items out of reach.  Keep the house free from clutter. Remove rugs or anything that might contribute to a fall.  Remove objects that might break and hurt the person.  Make sure lighting is good, both inside and outside.  Install grab rails as needed.  Use a monitoring device to alert you to falls or other needs for help.  Reduce confusion.  Keep familiar objects and people around.  Use night lights or dim lights at night.  Label items or areas.  Use reminders, notes, or directions for daily activities or tasks.  Keep a simple, consistent routine for waking, meals, bathing, dressing, and bedtime.  Create a calm, quiet environment.  Place large clocks and calendars prominently.  Display emergency numbers and home address near all telephones.  Use cues to establish different times of the day. An example is to open curtains to let the natural light in during the day.   Use effective communication.  Choose simple words and short sentences.  Use a gentle, calm tone of voice.  Be careful not to interrupt.  If the person is struggling to find a word or communicate a thought, try to provide the word or thought.  Ask one question at a time. Allow the person ample time to answer questions. Repeat the question again if the person does not respond.  Reduce nighttime restlessness.  Provide a comfortable bed.  Have a consistent nighttime routine.  Ensure a regular walking or physical activity schedule. Involve the person in daily activities as much as possible.  Limit napping during the day.  Limit  caffeine.  Attend social events that stimulate rather than overwhelm the senses.  Encourage good nutrition and hydration.  Reduce distractions during meal times and snacks.  Avoid foods that are too hot or too cold.  Monitor chewing and swallowing ability.  Continue with routine vision, hearing, dental, and medical screenings.  Give medicines only as directed by the health care provider.  Monitor driving abilities. Do not allow the person to drive when safe driving is no longer possible.  Register with an identification program which could provide location assistance in the event of a missing person situation. SEEK MEDICAL CARE IF:   New behavioral problems start such as moodiness, aggressiveness, or seeing things that are not there (hallucinations).  Any new problem with brain function happens. This includes problems with balance, speech, or falling a lot.  Problems with swallowing develop.  Any symptoms of other illness happen. Small changes or worsening in any aspect of brain function can be a sign that the illness is getting worse. It can also be a sign of another medical illness such as infection. Seeing a health care provider right  away is important. SEEK IMMEDIATE MEDICAL CARE IF:   A fever develops.  New or worsened confusion develops.  New or worsened sleepiness develops.  Staying awake becomes hard to do. Document Released: 03/27/2001 Document Revised: 02/15/2014 Document Reviewed: 02/26/2011 [facility] Patient Information 2015 [place], [place]. This information is not intended to replace advice given to you by your health care provider. Make sure you discuss any questions you have with your health care provider.

## 2014-06-14 NOTE — ED Notes (Signed)
Patient transported to CT 

## 2014-06-15 ENCOUNTER — Telehealth: Payer: Self-pay | Admitting: Internal Medicine

## 2014-06-15 NOTE — Telephone Encounter (Signed)
Please call the patient, arrange a ER followup for this week, over book ok

## 2014-06-15 NOTE — Telephone Encounter (Signed)
Spoke with Pt, she has an appt with Dr. Larose Kells on 06/25/2014 at 10:45.

## 2014-06-16 ENCOUNTER — Encounter: Payer: Self-pay | Admitting: Internal Medicine

## 2014-06-16 ENCOUNTER — Ambulatory Visit (INDEPENDENT_AMBULATORY_CARE_PROVIDER_SITE_OTHER): Payer: Medicare Other | Admitting: Internal Medicine

## 2014-06-16 VITALS — BP 126/74 | HR 71 | Temp 97.8°F | Wt 108.9 lb

## 2014-06-16 DIAGNOSIS — F0391 Unspecified dementia with behavioral disturbance: Secondary | ICD-10-CM

## 2014-06-16 DIAGNOSIS — F03918 Unspecified dementia, unspecified severity, with other behavioral disturbance: Secondary | ICD-10-CM | POA: Diagnosis not present

## 2014-06-16 DIAGNOSIS — Z79899 Other long term (current) drug therapy: Secondary | ICD-10-CM | POA: Diagnosis not present

## 2014-06-16 MED ORDER — DONEPEZIL HCL 5 MG PO TABS: 5.0000 mg | ORAL_TABLET | Freq: Every day | ORAL | Status: DC
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
## 2014-06-16 NOTE — Assessment & Plan Note (Addendum)
Worsening dementia with wandering, hallucinations. See history of present illness. Had a long conversation with Gay Filler, nonpharmacologic treatment includes therapeutic lying, distraction, , having a simple- consistent routine. She definitely needs supervision 24/7 and taking her medications (so far has been taking them by herself) No driving. For now we'll continue with Xanax and increase it to 3 times a day.  Continue with citalopram  Add a low dose of Aricept understanding it won't help in the short term and may cause confusion. Further pharmacological treatment may be needed in the future. Recommend family to read  The 36 hours day book. Also consider a memory unit  Followup 3-6 weeks No UTI on clinical grounds, will check a UA

## 2014-06-16 NOTE — Progress Notes (Signed)
Pre visit review using our clinic review tool, if applicable. No additional management support is needed unless otherwise documented below in the visit note. 

## 2014-06-16 NOTE — Patient Instructions (Signed)
Stop by the lab today  to get a urine sample Start Aricept Increase Xanax to 3 times a day as needed Needs supervision with her medication and company 24/ 7 The 36 hours day is a very good book Please come back in 6 weeks

## 2014-06-24 ENCOUNTER — Encounter: Payer: Self-pay | Admitting: Internal Medicine

## 2014-06-25 ENCOUNTER — Ambulatory Visit: Payer: Medicare Other | Admitting: Internal Medicine

## 2014-06-28 ENCOUNTER — Telehealth: Payer: Self-pay

## 2014-06-28 NOTE — Telephone Encounter (Signed)
UDS: 06/16/2014  Postive for Citalopram  Negative for Xanax PRN   Low risk per Dr. Larose Kells. 06/28/2014

## 2014-06-29 DIAGNOSIS — H2700 Aphakia, unspecified eye: Secondary | ICD-10-CM | POA: Diagnosis not present

## 2014-06-29 DIAGNOSIS — H33059 Total retinal detachment, unspecified eye: Secondary | ICD-10-CM | POA: Diagnosis not present

## 2014-07-08 ENCOUNTER — Encounter: Payer: Self-pay | Admitting: Internal Medicine

## 2014-07-26 ENCOUNTER — Encounter: Payer: Self-pay | Admitting: Internal Medicine

## 2014-07-26 ENCOUNTER — Ambulatory Visit (INDEPENDENT_AMBULATORY_CARE_PROVIDER_SITE_OTHER): Payer: Medicare Other | Admitting: Internal Medicine

## 2014-07-26 VITALS — BP 132/76 | HR 59 | Temp 97.9°F | Wt 106.4 lb

## 2014-07-26 DIAGNOSIS — L989 Disorder of the skin and subcutaneous tissue, unspecified: Secondary | ICD-10-CM

## 2014-07-26 DIAGNOSIS — Z23 Encounter for immunization: Secondary | ICD-10-CM

## 2014-07-26 DIAGNOSIS — F0391 Unspecified dementia with behavioral disturbance: Secondary | ICD-10-CM

## 2014-07-26 MED ORDER — DONEPEZIL HCL 5 MG PO TABS: 5.0000 mg | ORAL_TABLET | Freq: Two times a day (BID) | ORAL | Status: DC

## 2014-07-26 MED ORDER — ALPRAZOLAM 0.5 MG PO TABS: 0.5000 mg | ORAL_TABLET | Freq: Three times a day (TID) | ORAL | Status: DC | PRN

## 2014-07-26 NOTE — Assessment & Plan Note (Signed)
BCC ? Refer to derm

## 2014-07-26 NOTE — Progress Notes (Signed)
Pre visit review using our clinic review tool, if applicable. No additional management support is needed unless otherwise documented below in the visit note. 
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
## 2014-07-26 NOTE — Patient Instructions (Signed)
Please come back to the office 3 months for a routine check up , no  fasting

## 2014-07-26 NOTE — Assessment & Plan Note (Addendum)
Since her last times ,  started Aricept and Xanax was increased. Doing better, no hallucinations or wandering. She is still  very upset about not being able to drive but again she was reminded that does appropriate under the circumstances. That family now understands dementia better, they did some research. Plan: Increase Aricept to 10 mg daily if tolerated, cont xanax Return to the office in 3 months   flu shot today

## 2014-07-27 ENCOUNTER — Other Ambulatory Visit: Payer: Self-pay

## 2014-07-27 MED ORDER — ALENDRONATE SODIUM 70 MG PO TABS: 70.0000 mg | ORAL_TABLET | ORAL | Status: DC

## 2014-07-28 DIAGNOSIS — D485 Neoplasm of uncertain behavior of skin: Secondary | ICD-10-CM | POA: Diagnosis not present

## 2014-07-28 DIAGNOSIS — C44319 Basal cell carcinoma of skin of other parts of face: Secondary | ICD-10-CM | POA: Diagnosis not present

## 2014-07-28 DIAGNOSIS — L821 Other seborrheic keratosis: Secondary | ICD-10-CM | POA: Diagnosis not present

## 2014-08-06 ENCOUNTER — Encounter: Payer: Self-pay | Admitting: Internal Medicine

## 2014-08-17 ENCOUNTER — Telehealth: Payer: Self-pay | Admitting: Internal Medicine

## 2014-08-17 NOTE — Telephone Encounter (Signed)
error 

## 2014-08-20 ENCOUNTER — Other Ambulatory Visit: Payer: Self-pay | Admitting: Internal Medicine

## 2014-09-04 ENCOUNTER — Other Ambulatory Visit: Payer: Self-pay | Admitting: Internal Medicine

## 2014-09-06 ENCOUNTER — Other Ambulatory Visit: Payer: Self-pay

## 2014-09-06 DIAGNOSIS — C44319 Basal cell carcinoma of skin of other parts of face: Secondary | ICD-10-CM | POA: Diagnosis not present

## 2014-09-19 ENCOUNTER — Other Ambulatory Visit: Payer: Self-pay

## 2014-09-19 ENCOUNTER — Emergency Department (HOSPITAL_COMMUNITY)
Admission: EM | Admit: 2014-09-19 | Discharge: 2014-09-19 | Disposition: A | Discharge status: Home / Self Care | Payer: Medicare Other | Attending: Emergency Medicine | Admitting: Emergency Medicine

## 2014-09-19 ENCOUNTER — Emergency Department (HOSPITAL_COMMUNITY): Payer: Medicare Other

## 2014-09-19 ENCOUNTER — Encounter (HOSPITAL_COMMUNITY): Payer: Self-pay

## 2014-09-19 DIAGNOSIS — R4182 Altered mental status, unspecified: Secondary | ICD-10-CM | POA: Diagnosis present

## 2014-09-19 DIAGNOSIS — I10 Essential (primary) hypertension: Secondary | ICD-10-CM | POA: Diagnosis not present

## 2014-09-19 DIAGNOSIS — Z9842 Cataract extraction status, left eye: Secondary | ICD-10-CM | POA: Insufficient documentation

## 2014-09-19 DIAGNOSIS — J0130 Acute sphenoidal sinusitis, unspecified: Secondary | ICD-10-CM | POA: Diagnosis not present

## 2014-09-19 DIAGNOSIS — M8580 Other specified disorders of bone density and structure, unspecified site: Secondary | ICD-10-CM | POA: Diagnosis not present

## 2014-09-19 DIAGNOSIS — Z79899 Other long term (current) drug therapy: Secondary | ICD-10-CM | POA: Diagnosis not present

## 2014-09-19 DIAGNOSIS — R410 Disorientation, unspecified: Secondary | ICD-10-CM | POA: Diagnosis not present

## 2014-09-19 LAB — COMPREHENSIVE METABOLIC PANEL
ALK PHOS: 60 U/L (ref 39–117)
ALT: 18 U/L (ref 0–35)
AST: 31 U/L (ref 0–37)
Albumin: 4.1 g/dL (ref 3.5–5.2)
Anion gap: 13 (ref 5–15)
BUN: 24 mg/dL — ABNORMAL HIGH (ref 6–23)
CO2: 25 mEq/L (ref 19–32)
Calcium: 9.7 mg/dL (ref 8.4–10.5)
Chloride: 100 mEq/L (ref 96–112)
Creatinine, Ser: 1.09 mg/dL (ref 0.50–1.10)
GFR calc Af Amer: 52 mL/min — ABNORMAL LOW (ref >90)
GFR, EST NON AFRICAN AMERICAN: 45 mL/min — AB (ref >90)
Glucose, Bld: 86 mg/dL (ref 70–99)
Potassium: 4.2 mEq/L (ref 3.7–5.3)
SODIUM: 138 meq/L (ref 137–147)
Total Bilirubin: 0.4 mg/dL (ref 0.3–1.2)
Total Protein: 7.5 g/dL (ref 6.0–8.3)

## 2014-09-19 LAB — URINALYSIS, ROUTINE W REFLEX MICROSCOPIC
Bilirubin Urine: NEGATIVE
Glucose, UA: NEGATIVE mg/dL
Hgb urine dipstick: NEGATIVE
KETONES UR: NEGATIVE mg/dL
Leukocytes, UA: NEGATIVE
Nitrite: NEGATIVE
PROTEIN: NEGATIVE mg/dL
Specific Gravity, Urine: 1.006 (ref 1.005–1.030)
Urobilinogen, UA: 0.2 mg/dL (ref 0.0–1.0)
pH: 7 (ref 5.0–8.0)

## 2014-09-19 LAB — CBC WITH DIFFERENTIAL/PLATELET
BASOS ABS: 0 10*3/uL (ref 0.0–0.1)
Basophils Relative: 0 % (ref 0–1)
Eosinophils Absolute: 0.4 10*3/uL (ref 0.0–0.7)
Eosinophils Relative: 6 % — ABNORMAL HIGH (ref 0–5)
HEMATOCRIT: 40 % (ref 36.0–46.0)
Hemoglobin: 13 g/dL (ref 12.0–15.0)
Lymphocytes Relative: 33 % (ref 12–46)
Lymphs Abs: 2.2 10*3/uL (ref 0.7–4.0)
MCH: 28.9 pg (ref 26.0–34.0)
MCHC: 32.5 g/dL (ref 30.0–36.0)
MCV: 88.9 fL (ref 78.0–100.0)
Monocytes Absolute: 0.6 10*3/uL (ref 0.1–1.0)
Monocytes Relative: 9 % (ref 3–12)
NEUTROS ABS: 3.4 10*3/uL (ref 1.7–7.7)
Neutrophils Relative %: 52 % (ref 43–77)
Platelets: 190 10*3/uL (ref 150–400)
RBC: 4.5 MIL/uL (ref 3.87–5.11)
RDW: 13.3 % (ref 11.5–15.5)
WBC: 6.7 10*3/uL (ref 4.0–10.5)

## 2014-09-19 LAB — I-STAT TROPONIN, ED: Troponin i, poc: 0 ng/mL (ref 0.00–0.08)

## 2014-09-19 MED ORDER — AMOXICILLIN-POT CLAVULANATE 875-125 MG PO TABS
1.0000 | ORAL_TABLET | Freq: Once | ORAL | Status: AC
  Administered 2014-09-19: 1 via ORAL
  Filled 2014-09-19: qty 1

## 2014-09-19 MED ORDER — ALPRAZOLAM 0.5 MG PO TABS
0.5000 mg | ORAL_TABLET | Freq: Once | ORAL | Status: AC
  Administered 2014-09-19: 0.5 mg via ORAL
  Filled 2014-09-19: qty 1

## 2014-09-19 MED ORDER — AMOXICILLIN-POT CLAVULANATE 875-125 MG PO TABS: 1.0000 | ORAL_TABLET | Freq: Two times a day (BID) | ORAL | Status: DC

## 2014-09-19 MED ORDER — SODIUM CHLORIDE 0.9 % IV BOLUS (SEPSIS)
1000.0000 mL | Freq: Once | INTRAVENOUS | Status: AC
  Administered 2014-09-19: 1000 mL via INTRAVENOUS

## 2014-09-19 NOTE — Discharge Instructions (Signed)
Take augmentin for a week.   Case manager will contact you this week to arrange for assisted living or home care.   Follow up with your doctor.   Return to ER if you have worse confusion, agitation.

## 2014-09-19 NOTE — ED Notes (Addendum)
Pt presents with family with c/o increased confusion. Pt has a hx of dementia and lives alone but family reports that pt has not been acting right today and this is a significant change since yesterday, does not even recognize family at this point which is new for the patient. Pt's family also report that pt's medicine box was empty of her medicine that she was to take this morning and tonight. Family unsure as to whether pt took all of the medication. Family reporting increased confusion and lethargy as well today.

## 2014-09-19 NOTE — ED Notes (Signed)
Gave Dr. Darl Householder EKG for pt.
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
## 2014-09-19 NOTE — ED Notes (Signed)
Patient transported to X-ray 

## 2014-09-19 NOTE — ED Notes (Signed)
Unable to get any urine from in and out cath. Will try again later.

## 2014-09-20 LAB — URINE CULTURE: Colony Count: 4000

## 2014-09-21 ENCOUNTER — Telehealth: Payer: Self-pay | Admitting: *Deleted

## 2014-09-21 NOTE — Telephone Encounter (Signed)
Forms dropped off by patient's sone for [facility]: Independent and Supportive Living Physician's Report. Form filled out as much as possible and forwarded to Dr. Larose Kells. JG//CMA

## 2014-09-28 DIAGNOSIS — Z7689 Persons encountering health services in other specified circumstances: Secondary | ICD-10-CM

## 2014-09-28 NOTE — Telephone Encounter (Signed)
Completed forms faxed to patient's son Charlotte Crumb. JG//CMA

## 2014-10-20 ENCOUNTER — Ambulatory Visit: Payer: Medicare Other | Admitting: Internal Medicine

## 2014-10-26 ENCOUNTER — Encounter: Payer: Self-pay | Admitting: Internal Medicine

## 2014-10-26 ENCOUNTER — Ambulatory Visit (INDEPENDENT_AMBULATORY_CARE_PROVIDER_SITE_OTHER): Payer: Medicare Other | Admitting: Internal Medicine

## 2014-10-26 VITALS — BP 173/79 | HR 49 | Temp 97.8°F | Ht 62.0 in | Wt 103.1 lb

## 2014-10-26 DIAGNOSIS — F0390 Unspecified dementia without behavioral disturbance: Secondary | ICD-10-CM

## 2014-10-26 DIAGNOSIS — I10 Essential (primary) hypertension: Secondary | ICD-10-CM | POA: Diagnosis not present

## 2014-10-26 NOTE — Progress Notes (Signed)
Pre visit review using our clinic review tool, if applicable. No additional management support is needed unless otherwise documented below in the visit note. 

## 2014-10-26 NOTE — Patient Instructions (Signed)
Check the  blood pressure weekly  Be sure your blood pressure is between 110/65 and  145/85.  if it is consistently higher or lower, let me know   Please come back to the office in 4 to 6 months  for a routine check up

## 2014-10-26 NOTE — Assessment & Plan Note (Signed)
Moved to independent living facility in December, seems very happy, no change, continue Aricept.
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
## 2014-10-26 NOTE — Assessment & Plan Note (Signed)
BP today slightly elevated but reports "normal" BP readings at home, recommend to continue checking once a week, parameters provided, see instructions

## 2014-10-29 ENCOUNTER — Telehealth: Payer: Self-pay | Admitting: Internal Medicine

## 2014-10-29 NOTE — Telephone Encounter (Signed)
Pt is requesting refill on Alprazolam.  Last OV: 10/26/2014 Last Fill: 07/26/2014 # 10 1RF UDS: 06/16/2014 Low risk  Please advise.

## 2014-10-29 NOTE — Telephone Encounter (Signed)
done

## 2014-10-29 NOTE — Telephone Encounter (Signed)
Faxed to [facility].

## 2014-11-25 ENCOUNTER — Telehealth: Payer: Self-pay

## 2014-11-25 MED ORDER — DONEPEZIL HCL 10 MG PO TABS: 10.0000 mg | ORAL_TABLET | Freq: Every day | ORAL | Status: DC

## 2014-11-25 NOTE — Telephone Encounter (Signed)
Received letter from [facility] regarding Pts Donepezil 5 mg tablets. Informed that they would only pay for 1 tablet daily instead of 2 tablets daily. Dr. Larose Kells reviewed chart and instructed to increase Donepezil to 10 mg 1 tablet daily, tried calling Pt to inform, [facility] for Pt to return call.

## 2014-12-13 ENCOUNTER — Telehealth: Payer: Self-pay | Admitting: Internal Medicine

## 2014-12-13 NOTE — Telephone Encounter (Signed)
Caller name:Sally, Nay Relation to pt: daughter  Call back number: [phone]   Reason for call:  Daughter calling to check on Veterans paperwork for [place] benefits.  Daughter stated she dropped it off last week with a self addressed envelope. Please leave detail message

## 2014-12-14 ENCOUNTER — Other Ambulatory Visit: Payer: Self-pay | Admitting: Internal Medicine

## 2014-12-15 NOTE — Telephone Encounter (Signed)
Dr. Larose Kells has paperwork, has not been completed yet.

## 2014-12-15 NOTE — Telephone Encounter (Signed)
Do you have this paperwork?

## 2014-12-16 ENCOUNTER — Encounter: Payer: Self-pay | Admitting: *Deleted

## 2014-12-16 NOTE — Telephone Encounter (Signed)
Completed documents mailed to patient's son, Melva Faux. Copy sent for scanning. JG//CMA

## 2015-01-25 ENCOUNTER — Ambulatory Visit (INDEPENDENT_AMBULATORY_CARE_PROVIDER_SITE_OTHER): Payer: Medicare Other | Admitting: Internal Medicine

## 2015-01-25 ENCOUNTER — Encounter: Payer: Self-pay | Admitting: Internal Medicine

## 2015-01-25 VITALS — BP 124/68 | HR 55 | Temp 98.2°F | Ht 62.0 in | Wt 104.4 lb

## 2015-01-25 DIAGNOSIS — F0391 Unspecified dementia with behavioral disturbance: Secondary | ICD-10-CM | POA: Diagnosis not present

## 2015-01-25 DIAGNOSIS — R739 Hyperglycemia, unspecified: Secondary | ICD-10-CM

## 2015-01-25 DIAGNOSIS — B3749 Other urogenital candidiasis: Secondary | ICD-10-CM | POA: Diagnosis not present

## 2015-01-25 LAB — URINALYSIS, ROUTINE W REFLEX MICROSCOPIC
Bilirubin Urine: NEGATIVE
HGB URINE DIPSTICK: NEGATIVE
Ketones, ur: NEGATIVE
Leukocytes, UA: NEGATIVE
Nitrite: NEGATIVE
RBC / HPF: NONE SEEN (ref 0–?)
TOTAL PROTEIN, URINE-UPE24: NEGATIVE
URINE GLUCOSE: NEGATIVE
UROBILINOGEN UA: 0.2 (ref 0.0–1.0)
WBC UA: NONE SEEN (ref 0–?)
pH: 6 (ref 5.0–8.0)

## 2015-01-25 LAB — HEMOGLOBIN A1C: Hgb A1c MFr Bld: 5.9 % (ref 4.6–6.5)
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
## 2015-01-25 NOTE — Progress Notes (Signed)
Pre visit review using our clinic review tool, if applicable. No additional management support is needed unless otherwise documented below in the visit note. 

## 2015-01-25 NOTE — Assessment & Plan Note (Addendum)
Dementia, Gradual decline in her overall status, the staff at the facility are concerned  about a UTI, review of system however is negative for UTI, pneumonia. She is not taking  new medications. At this point  the family made the decision to transfer her to a memory unit which I think is completely appropriate (pt wanders and does not take meds correctly putting  herself at risk). Plan:  Check a UA, urine culture and A1c, other labs were okay recently Neurology consult as requested by the memory unit Continue same medication Paperwork for the memory unit placement completed Needs a  PPD before going to the facility, she is on a waiting list consequently they will call and set that up.  We will also check an A1c given history of hyperglycemia Face-to-face >> 30 minutes

## 2015-01-27 LAB — URINE CULTURE
Colony Count: NO GROWTH
Organism ID, Bacteria: NO GROWTH

## 2015-02-11 ENCOUNTER — Telehealth: Payer: Self-pay | Admitting: Internal Medicine

## 2015-02-11 NOTE — Telephone Encounter (Signed)
Paperwork for TB received yesterday, 02/10/2015, with Dr. Larose Kells.

## 2015-02-11 NOTE — Telephone Encounter (Signed)
Caller name: melanie adler at [facility]  Relation to pt: Call back number: [phone] F: [phone] Pharmacy:  Reason for call:   Wanting to know if we received paperwork for TB? Needs order for this.

## 2015-02-14 NOTE — Telephone Encounter (Signed)
Caller name: melanie adler at [facility]  Relation to pt:  Call back number: [phone] F: [phone]  Please send order for TB test asap. She states pt is a safety risk and they need to do TB test in order to transfer to memory care bldg. She is currently in independent care bldg.

## 2015-02-20 ENCOUNTER — Other Ambulatory Visit: Payer: Self-pay | Admitting: Internal Medicine

## 2015-02-21 NOTE — Telephone Encounter (Signed)
[facility]

## 2015-02-21 NOTE — Telephone Encounter (Signed)
Ok 90 and 1 Rf

## 2015-02-21 NOTE — Telephone Encounter (Signed)
Pt is requesting refill on Alprazolam.  Last OV: 01/25/2015  Last Fill: 10/29/2014 # 23 1RF UDS: 06/16/2014 Low risk  Please advise.

## 2015-02-25 ENCOUNTER — Ambulatory Visit (INDEPENDENT_AMBULATORY_CARE_PROVIDER_SITE_OTHER): Payer: Medicare Other | Admitting: Neurology

## 2015-02-25 ENCOUNTER — Encounter: Payer: Self-pay | Admitting: Neurology

## 2015-02-25 VITALS — BP 106/60 | HR 48 | Resp 16 | Ht 62.0 in | Wt 107.1 lb

## 2015-02-25 DIAGNOSIS — F0280 Dementia in other diseases classified elsewhere without behavioral disturbance: Secondary | ICD-10-CM | POA: Diagnosis not present

## 2015-02-25 DIAGNOSIS — G309 Alzheimer's disease, unspecified: Secondary | ICD-10-CM

## 2015-02-25 DIAGNOSIS — I679 Cerebrovascular disease, unspecified: Secondary | ICD-10-CM | POA: Diagnosis not present

## 2015-02-25 MED ORDER — MEMANTINE HCL ER 7 & 14 & 21 &28 MG PO CP24: ORAL_CAPSULE | ORAL | Status: DC

## 2015-02-25 MED ORDER — MEMANTINE HCL ER 28 MG PO CP24: 28.0000 mg | ORAL_CAPSULE | Freq: Every day | ORAL | Status: DC
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
## 2015-02-25 NOTE — Addendum Note (Signed)
Addended by: Charyl Bigger E on: 02/25/2015 02:42 PM   Modules accepted: Orders

## 2015-02-25 NOTE — Patient Instructions (Addendum)
Start Namenda starter pack.  Take as directed and titrate to 28mg  daily.  Afterwards, start 28mg  daily Continue Aricept 10mg  daily at bedtime I would start aspirin 81mg  daily Follow up in 6 months.

## 2015-02-28 ENCOUNTER — Ambulatory Visit: Payer: Medicare Other | Admitting: Internal Medicine

## 2015-03-06 ENCOUNTER — Other Ambulatory Visit: Payer: Self-pay | Admitting: Internal Medicine

## 2015-03-16 ENCOUNTER — Other Ambulatory Visit: Payer: Self-pay | Admitting: Internal Medicine

## 2015-03-28 ENCOUNTER — Ambulatory Visit: Payer: Medicare Other | Admitting: Internal Medicine

## 2015-04-15 ENCOUNTER — Other Ambulatory Visit: Payer: Self-pay | Admitting: Internal Medicine

## 2015-04-20 ENCOUNTER — Telehealth: Payer: Self-pay | Admitting: Internal Medicine

## 2015-04-20 NOTE — Telephone Encounter (Addendum)
Caller name: Orrick,Wayne Relation to pt: son  Call back number: [phone]   Reason for call:  As per son MD is aware pt is currently in a group home for patients with memory loss. Son is requesting a sleeping medication due to pt wandering in other patients room at night and memory loss declining. Please advise

## 2015-04-20 NOTE — Telephone Encounter (Signed)
Please advise 

## 2015-04-21 NOTE — Telephone Encounter (Signed)
Recommend to take one scheduled Xanax at bedtime. Also, please get a current medication list

## 2015-04-21 NOTE — Telephone Encounter (Signed)
Spoke with Cheryl Armstrong, informed him of Dr. Larose Kells recommendations. Cheryl Armstrong informed he believes that the memory unit has been giving Cheryl Armstrong Xanax but does not know frequency. I informed him that the Pt can take 1 tablet by mouth three times daily as needed. Informed him that if Xanax does not seem to be helping with sleep to let us know. Also, asked Cheryl Armstrong if he had an updated medication list which he informed he can get from the memory unit but believes everything is still the same as the last visit.

## 2015-04-21 NOTE — Telephone Encounter (Signed)
LMOM on Pt's son, [name], home number informing him to return call.

## 2015-05-10 ENCOUNTER — Telehealth: Payer: Self-pay | Admitting: Internal Medicine

## 2015-05-10 NOTE — Telephone Encounter (Signed)
Caller name: Tera Helper Relationship to patient: Heritage Hervey Ard Can be reached:[phone] Pharmacy:  Reason for call: Needs an order for a two step Tb Skin Test faxed to [phone]

## 2015-05-10 NOTE — Telephone Encounter (Signed)
Order written. Please fax to number above. Results will need to go to Dr. Larose Kells or myself.

## 2015-05-11 NOTE — Telephone Encounter (Signed)
Faxed order for TB to Tera Helper with [facility].  Confirmation received.//AB/CMA

## 2015-05-12 ENCOUNTER — Other Ambulatory Visit: Payer: Self-pay | Admitting: Internal Medicine

## 2015-05-12 NOTE — Telephone Encounter (Signed)
Metoprolol and Celexa refused due to being requested too soon- notified patient that she should have refills available at pharmacy and to call office if any problems.  She stated understanding and agreed.

## 2015-05-25 ENCOUNTER — Telehealth: Payer: Self-pay | Admitting: *Deleted

## 2015-05-25 NOTE — Telephone Encounter (Signed)
Received plan of care via fax from [facility] at [facility]. Forwarded to Dr. Larose Kells. JG//CMA

## 2015-05-30 NOTE — Telephone Encounter (Signed)
Signed forms faxed successfully to [facility]. Sent for scanning. JG//CMA

## 2015-06-01 ENCOUNTER — Telehealth: Payer: Self-pay | Admitting: Internal Medicine

## 2015-06-01 NOTE — Telephone Encounter (Signed)
pre visit letter mailed 06/01/15 °

## 2015-06-16 ENCOUNTER — Other Ambulatory Visit: Payer: Self-pay

## 2015-06-16 ENCOUNTER — Other Ambulatory Visit: Payer: Self-pay | Admitting: Internal Medicine

## 2015-06-22 ENCOUNTER — Ambulatory Visit (INDEPENDENT_AMBULATORY_CARE_PROVIDER_SITE_OTHER): Payer: Medicare Other | Admitting: Internal Medicine

## 2015-06-22 ENCOUNTER — Encounter: Payer: Self-pay | Admitting: Internal Medicine

## 2015-06-22 VITALS — BP 126/70 | HR 51 | Temp 98.1°F | Ht 62.0 in | Wt 117.2 lb

## 2015-06-22 DIAGNOSIS — F0390 Unspecified dementia without behavioral disturbance: Secondary | ICD-10-CM

## 2015-06-22 DIAGNOSIS — Z09 Encounter for follow-up examination after completed treatment for conditions other than malignant neoplasm: Secondary | ICD-10-CM

## 2015-06-22 DIAGNOSIS — F411 Generalized anxiety disorder: Secondary | ICD-10-CM

## 2015-06-22 DIAGNOSIS — R7309 Other abnormal glucose: Secondary | ICD-10-CM | POA: Diagnosis not present

## 2015-06-22 DIAGNOSIS — I10 Essential (primary) hypertension: Secondary | ICD-10-CM | POA: Diagnosis not present

## 2015-06-22 DIAGNOSIS — Z23 Encounter for immunization: Secondary | ICD-10-CM

## 2015-06-22 DIAGNOSIS — R7303 Prediabetes: Secondary | ICD-10-CM

## 2015-06-22 MED ORDER — AMLODIPINE BESYLATE 5 MG PO TABS: 5.0000 mg | ORAL_TABLET | Freq: Every day | ORAL | Status: DC
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
## 2015-06-22 NOTE — Patient Instructions (Signed)
Continue taking the same medications  Come back in 5-6 months for a routine checkup  Fall Prevention and Home Safety Falls cause injuries and can affect all age groups. It is possible to use preventive measures to significantly decrease the likelihood of falls. There are many simple measures which can make your home safer and prevent falls. OUTDOORS  Repair cracks and edges of walkways and driveways.  Remove high doorway thresholds.  Trim shrubbery on the main path into your home.  Have good outside lighting.  Clear walkways of tools, rocks, debris, and clutter.  Check that handrails are not broken and are securely fastened. Both sides of steps should have handrails.  Have leaves, snow, and ice cleared regularly.  Use sand or salt on walkways during winter months.  In the garage, clean up grease or oil spills. BATHROOM  Install night lights.  Install grab bars by the toilet and in the tub and shower.  Use non-skid mats or decals in the tub or shower.  Place a plastic non-slip stool in the shower to sit on, if needed.  Keep floors dry and clean up all water on the floor immediately.  Remove soap buildup in the tub or shower on a regular basis.  Secure bath mats with non-slip, double-sided rug tape.  Remove throw rugs and tripping hazards from the floors. BEDROOMS  Install night lights.  Make sure a bedside light is easy to reach.  Do not use oversized bedding.  Keep a telephone by your bedside.  Have a firm chair with side arms to use for getting dressed.  Remove throw rugs and tripping hazards from the floor. KITCHEN  Keep handles on pots and pans turned toward the center of the stove. Use back burners when possible.  Clean up spills quickly and allow time for drying.  Avoid walking on wet floors.  Avoid hot utensils and knives.  Position shelves so they are not too high or low.  Place commonly used objects within easy reach.  If necessary, use a  sturdy step stool with a grab bar when reaching.  Keep electrical cables out of the way.  Do not use floor polish or wax that makes floors slippery. If you must use wax, use non-skid floor wax.  Remove throw rugs and tripping hazards from the floor. STAIRWAYS  Never leave objects on stairs.  Place handrails on both sides of stairways and use them. Fix any loose handrails. Make sure handrails on both sides of the stairways are as long as the stairs.  Check carpeting to make sure it is firmly attached along stairs. Make repairs to worn or loose carpet promptly.  Avoid placing throw rugs at the top or bottom of stairways, or properly secure the rug with carpet tape to prevent slippage. Get rid of throw rugs, if possible.  Have an electrician put in a light switch at the top and bottom of the stairs. OTHER FALL PREVENTION TIPS  Wear low-heel or rubber-soled shoes that are supportive and fit well. Wear closed toe shoes.  When using a stepladder, make sure it is fully opened and both spreaders are firmly locked. Do not climb a closed stepladder.  Add color or contrast paint or tape to grab bars and handrails in your home. Place contrasting color strips on first and last steps.  Learn and use mobility aids as needed. Install an electrical emergency response system.  Turn on lights to avoid dark areas. Replace light bulbs that burn out immediately. Get light  switches that glow.  Arrange furniture to create clear pathways. Keep furniture in the same place.  Firmly attach carpet with non-skid or double-sided tape.  Eliminate uneven floor surfaces.  Select a carpet pattern that does not visually hide the edge of steps.  Be aware of all pets. OTHER HOME SAFETY TIPS  Set the water temperature for 120 F (48.8 C).  Keep emergency numbers on or near the telephone.  Keep smoke detectors on every level of the home and near sleeping areas. Document Released: 09/21/2002 Document Revised:  04/01/2012 Document Reviewed: 12/21/2011 [facility] Patient Information 2015 [place], [place]. This information is not intended to replace advice given to you by your health care provider. Make sure you discuss any questions you have with your health care provider.

## 2015-06-22 NOTE — Progress Notes (Signed)
Pre visit review using our clinic review tool, if applicable. No additional management support is needed unless otherwise documented below in the visit note. 

## 2015-06-23 ENCOUNTER — Encounter: Payer: Self-pay | Admitting: Internal Medicine

## 2015-06-23 DIAGNOSIS — Z09 Encounter for follow-up examination after completed treatment for conditions other than malignant neoplasm: Secondary | ICD-10-CM | POA: Insufficient documentation
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
## 2015-06-25 ENCOUNTER — Encounter (HOSPITAL_COMMUNITY): Payer: Self-pay | Admitting: Emergency Medicine

## 2015-06-25 ENCOUNTER — Emergency Department (HOSPITAL_COMMUNITY)
Admission: EM | Admit: 2015-06-25 | Discharge: 2015-06-26 | Disposition: A | Discharge status: Skilled Nursing Facility | Payer: Medicare Other | Attending: Emergency Medicine | Admitting: Emergency Medicine

## 2015-06-25 DIAGNOSIS — F419 Anxiety disorder, unspecified: Secondary | ICD-10-CM | POA: Insufficient documentation

## 2015-06-25 DIAGNOSIS — Y998 Other external cause status: Secondary | ICD-10-CM | POA: Diagnosis not present

## 2015-06-25 DIAGNOSIS — Z8669 Personal history of other diseases of the nervous system and sense organs: Secondary | ICD-10-CM | POA: Insufficient documentation

## 2015-06-25 DIAGNOSIS — S0083XA Contusion of other part of head, initial encounter: Secondary | ICD-10-CM | POA: Insufficient documentation

## 2015-06-25 DIAGNOSIS — S0990XA Unspecified injury of head, initial encounter: Secondary | ICD-10-CM | POA: Diagnosis not present

## 2015-06-25 DIAGNOSIS — M8580 Other specified disorders of bone density and structure, unspecified site: Secondary | ICD-10-CM | POA: Diagnosis not present

## 2015-06-25 DIAGNOSIS — Z7982 Long term (current) use of aspirin: Secondary | ICD-10-CM | POA: Insufficient documentation

## 2015-06-25 DIAGNOSIS — I10 Essential (primary) hypertension: Secondary | ICD-10-CM | POA: Insufficient documentation

## 2015-06-25 DIAGNOSIS — R6889 Other general symptoms and signs: Secondary | ICD-10-CM | POA: Diagnosis not present

## 2015-06-25 DIAGNOSIS — F0390 Unspecified dementia without behavioral disturbance: Secondary | ICD-10-CM | POA: Diagnosis not present

## 2015-06-25 DIAGNOSIS — Y9389 Activity, other specified: Secondary | ICD-10-CM | POA: Diagnosis not present

## 2015-06-25 DIAGNOSIS — T1490 Injury, unspecified: Secondary | ICD-10-CM | POA: Diagnosis not present

## 2015-06-25 DIAGNOSIS — S0993XA Unspecified injury of face, initial encounter: Secondary | ICD-10-CM | POA: Diagnosis present

## 2015-06-25 DIAGNOSIS — Z79899 Other long term (current) drug therapy: Secondary | ICD-10-CM | POA: Insufficient documentation

## 2015-06-25 DIAGNOSIS — Y92129 Unspecified place in nursing home as the place of occurrence of the external cause: Secondary | ICD-10-CM | POA: Insufficient documentation
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
## 2015-06-26 ENCOUNTER — Emergency Department (HOSPITAL_COMMUNITY): Payer: Medicare Other

## 2015-06-26 DIAGNOSIS — S0083XA Contusion of other part of head, initial encounter: Secondary | ICD-10-CM | POA: Diagnosis not present

## 2015-06-26 DIAGNOSIS — S0990XA Unspecified injury of head, initial encounter: Secondary | ICD-10-CM | POA: Diagnosis not present

## 2015-06-26 NOTE — Discharge Instructions (Signed)
Contusion °A contusion is a deep bruise. Contusions are the result of an injury that caused bleeding under the skin. The contusion may turn blue, purple, or yellow. Minor injuries will give you a painless contusion, but more severe contusions may stay painful and swollen for a few weeks.  °CAUSES  °A contusion is usually caused by a blow, trauma, or direct force to an area of the body. °SYMPTOMS  °· Swelling and redness of the injured area. °· Bruising of the injured area. °· Tenderness and soreness of the injured area. °· Pain. °DIAGNOSIS  °The diagnosis can be made by taking a history and physical exam. An X-ray, CT scan, or MRI may be needed to determine if there were any associated injuries, such as fractures. °TREATMENT  °Specific treatment will depend on what area of the body was injured. In general, the best treatment for a contusion is resting, icing, elevating, and applying cold compresses to the injured area. Over-the-counter medicines may also be recommended for pain control. Ask your caregiver what the best treatment is for your contusion. °HOME CARE INSTRUCTIONS  °· Put ice on the injured area. °¨ Put ice in a plastic bag. °¨ Place a towel between your skin and the bag. °¨ Leave the ice on for 15-20 minutes, 3-4 times a day, or as directed by your health care provider. °· Only take over-the-counter or prescription medicines for pain, discomfort, or fever as directed by your caregiver. Your caregiver may recommend avoiding anti-inflammatory medicines (aspirin, ibuprofen, and naproxen) for 48 hours because these medicines may increase bruising. °· Rest the injured area. °· If possible, elevate the injured area to reduce swelling. °SEEK IMMEDIATE MEDICAL CARE IF:  °· You have increased bruising or swelling. °· You have pain that is getting worse. °· Your swelling or pain is not relieved with medicines. °MAKE SURE YOU:  °· Understand these instructions. °· Will watch your condition. °· Will get help right  away if you are not doing well or get worse. °Document Released: 07/11/2005 Document Revised: 10/06/2013 Document Reviewed: 08/06/2011 °Exit[employer]® Patient Information ©2015 [employer]. This information is not intended to replace advice given to you by your health care provider. Make sure you discuss any questions you have with your health care provider. ° °

## 2015-06-27 ENCOUNTER — Telehealth: Payer: Self-pay
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
## 2015-08-12 ENCOUNTER — Other Ambulatory Visit: Payer: Self-pay | Admitting: Internal Medicine

## 2015-08-30 ENCOUNTER — Other Ambulatory Visit: Payer: Self-pay | Admitting: Internal Medicine

## 2015-09-01 ENCOUNTER — Ambulatory Visit (INDEPENDENT_AMBULATORY_CARE_PROVIDER_SITE_OTHER): Payer: Medicare Other | Admitting: Neurology

## 2015-09-01 ENCOUNTER — Encounter: Payer: Self-pay | Admitting: Neurology

## 2015-09-01 VITALS — BP 170/60 | HR 51 | Ht 62.0 in | Wt 121.0 lb

## 2015-09-01 DIAGNOSIS — G309 Alzheimer's disease, unspecified: Secondary | ICD-10-CM | POA: Diagnosis not present

## 2015-09-01 DIAGNOSIS — I679 Cerebrovascular disease, unspecified: Secondary | ICD-10-CM

## 2015-09-01 DIAGNOSIS — I10 Essential (primary) hypertension: Secondary | ICD-10-CM | POA: Diagnosis not present

## 2015-09-01 DIAGNOSIS — F0280 Dementia in other diseases classified elsewhere without behavioral disturbance: Secondary | ICD-10-CM

## 2015-09-01 NOTE — Patient Instructions (Addendum)
1.  Try to keep Cheryl Armstrong awake after dinner, until at least 8 or 9 pm.  Take melatonin 1 3 mg one hour before bed (on empty stomach). 2.  Continue Aricept and Namenda 3.  Follow up in 6 months 4. Try to use Xanax sparingly.
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
## 2015-09-16 ENCOUNTER — Other Ambulatory Visit: Payer: Self-pay | Admitting: Internal Medicine

## 2015-09-28 ENCOUNTER — Other Ambulatory Visit: Payer: Self-pay

## 2015-09-28 MED ORDER — MEMANTINE HCL ER 28 MG PO CP24: 28.0000 mg | ORAL_CAPSULE | Freq: Every day | ORAL | Status: DC

## 2015-09-28 NOTE — Telephone Encounter (Signed)
Last OV: 09/01/15 Next OV: 03/02/16

## 2015-11-18 ENCOUNTER — Other Ambulatory Visit: Payer: Self-pay | Admitting: Internal Medicine

## 2015-11-27 ENCOUNTER — Emergency Department (HOSPITAL_COMMUNITY)
Admission: EM | Admit: 2015-11-27 | Discharge: 2015-11-27 | Disposition: A | Discharge status: Home / Self Care | Payer: Medicare Other | Attending: Emergency Medicine | Admitting: Emergency Medicine

## 2015-11-27 ENCOUNTER — Emergency Department (HOSPITAL_COMMUNITY): Payer: Medicare Other

## 2015-11-27 ENCOUNTER — Encounter (HOSPITAL_COMMUNITY): Payer: Self-pay

## 2015-11-27 DIAGNOSIS — Z8669 Personal history of other diseases of the nervous system and sense organs: Secondary | ICD-10-CM | POA: Insufficient documentation

## 2015-11-27 DIAGNOSIS — Y998 Other external cause status: Secondary | ICD-10-CM | POA: Insufficient documentation

## 2015-11-27 DIAGNOSIS — Z79899 Other long term (current) drug therapy: Secondary | ICD-10-CM | POA: Insufficient documentation

## 2015-11-27 DIAGNOSIS — Z7982 Long term (current) use of aspirin: Secondary | ICD-10-CM | POA: Insufficient documentation

## 2015-11-27 DIAGNOSIS — F419 Anxiety disorder, unspecified: Secondary | ICD-10-CM | POA: Insufficient documentation

## 2015-11-27 DIAGNOSIS — F0390 Unspecified dementia without behavioral disturbance: Secondary | ICD-10-CM | POA: Insufficient documentation

## 2015-11-27 DIAGNOSIS — S0190XA Unspecified open wound of unspecified part of head, initial encounter: Secondary | ICD-10-CM | POA: Diagnosis not present

## 2015-11-27 DIAGNOSIS — S199XXA Unspecified injury of neck, initial encounter: Secondary | ICD-10-CM | POA: Diagnosis not present

## 2015-11-27 DIAGNOSIS — S0101XA Laceration without foreign body of scalp, initial encounter: Secondary | ICD-10-CM

## 2015-11-27 DIAGNOSIS — Y9289 Other specified places as the place of occurrence of the external cause: Secondary | ICD-10-CM | POA: Insufficient documentation

## 2015-11-27 DIAGNOSIS — S0990XA Unspecified injury of head, initial encounter: Secondary | ICD-10-CM | POA: Diagnosis not present

## 2015-11-27 DIAGNOSIS — Y9389 Activity, other specified: Secondary | ICD-10-CM | POA: Insufficient documentation

## 2015-11-27 DIAGNOSIS — W1839XA Other fall on same level, initial encounter: Secondary | ICD-10-CM | POA: Diagnosis not present

## 2015-11-27 DIAGNOSIS — Y92129 Unspecified place in nursing home as the place of occurrence of the external cause: Secondary | ICD-10-CM | POA: Diagnosis not present

## 2015-11-27 DIAGNOSIS — Z23 Encounter for immunization: Secondary | ICD-10-CM | POA: Insufficient documentation

## 2015-11-27 DIAGNOSIS — M8580 Other specified disorders of bone density and structure, unspecified site: Secondary | ICD-10-CM | POA: Diagnosis not present

## 2015-11-27 DIAGNOSIS — S098XXA Other specified injuries of head, initial encounter: Secondary | ICD-10-CM | POA: Diagnosis not present

## 2015-11-27 DIAGNOSIS — I10 Essential (primary) hypertension: Secondary | ICD-10-CM | POA: Diagnosis not present

## 2015-11-27 LAB — BASIC METABOLIC PANEL
ANION GAP: 8 (ref 5–15)
BUN: 19 mg/dL (ref 6–20)
CALCIUM: 9.4 mg/dL (ref 8.9–10.3)
CO2: 27 mmol/L (ref 22–32)
Chloride: 105 mmol/L (ref 101–111)
Creatinine, Ser: 0.93 mg/dL (ref 0.44–1.00)
GFR calc non Af Amer: 54 mL/min — ABNORMAL LOW (ref >60)
GLUCOSE: 168 mg/dL — AB (ref 65–99)
Potassium: 3.7 mmol/L (ref 3.5–5.1)
Sodium: 140 mmol/L (ref 135–145)

## 2015-11-27 LAB — URINALYSIS, ROUTINE W REFLEX MICROSCOPIC
Bilirubin Urine: NEGATIVE
Glucose, UA: NEGATIVE mg/dL
Hgb urine dipstick: NEGATIVE
KETONES UR: NEGATIVE mg/dL
LEUKOCYTES UA: NEGATIVE
NITRITE: NEGATIVE
PROTEIN: NEGATIVE mg/dL
Specific Gravity, Urine: 1.011 (ref 1.005–1.030)
pH: 7 (ref 5.0–8.0)

## 2015-11-27 LAB — CBC WITH DIFFERENTIAL/PLATELET
BASOS ABS: 0 10*3/uL (ref 0.0–0.1)
BASOS PCT: 0 %
EOS PCT: 4 %
Eosinophils Absolute: 0.2 10*3/uL (ref 0.0–0.7)
HCT: 40.2 % (ref 36.0–46.0)
Hemoglobin: 13.1 g/dL (ref 12.0–15.0)
Lymphocytes Relative: 23 %
Lymphs Abs: 1.2 10*3/uL (ref 0.7–4.0)
MCH: 29 pg (ref 26.0–34.0)
MCHC: 32.6 g/dL (ref 30.0–36.0)
MCV: 89.1 fL (ref 78.0–100.0)
MONO ABS: 0.5 10*3/uL (ref 0.1–1.0)
Monocytes Relative: 10 %
NEUTROS PCT: 63 %
Neutro Abs: 3.3 10*3/uL (ref 1.7–7.7)
Platelets: 189 10*3/uL (ref 150–400)
RBC: 4.51 MIL/uL (ref 3.87–5.11)
RDW: 13.1 % (ref 11.5–15.5)
WBC: 5.2 10*3/uL (ref 4.0–10.5)

## 2015-11-27 LAB — I-STAT TROPONIN, ED: Troponin i, poc: 0.01 ng/mL (ref 0.00–0.08)

## 2015-11-27 MED ORDER — TETANUS-DIPHTH-ACELL PERTUSSIS 5-2.5-18.5 LF-MCG/0.5 IM SUSP
0.5000 mL | Freq: Once | INTRAMUSCULAR | Status: AC
  Administered 2015-11-27: 0.5 mL via INTRAMUSCULAR
  Filled 2015-11-27: qty 0.5
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
## 2015-11-27 NOTE — Discharge Instructions (Signed)
Staple removal in a week.   Fall precautions.   See your doctor.   Return to ER if you have worse bleeding, headaches, vomiting.

## 2015-11-27 NOTE — ED Notes (Signed)
Bed: WA05 Expected date:  Expected time:  Means of arrival:  Comments: 

## 2015-11-27 NOTE — ED Notes (Signed)
Patient transported by [facility] from [facility].  Patient had unwitnessed fall this morning.  Patient was found ambulating after fall.  Patient has laceration to left parietal area.  Patient has no memory of fall - hx of dementia.

## 2015-11-28 ENCOUNTER — Telehealth: Payer: Self-pay

## 2015-11-28 NOTE — Telephone Encounter (Signed)
-----   Message from Margot Ables sent at 11/28/2015  9:17 AM EST ----- Regarding: RE: needs ER f/u, plase arrange Scheduled for 2/21 with pts daughter-in-law ----- Message -----    From: Oneida Alar, NT    Sent: 11/28/2015   8:32 AM      To: Margot Ables Subject: FW: needs ER f/u, plase arrange                Can you call this patient for an ed follow up see below ----- Message -----    From: Damita Dunnings, CMA    Sent: 11/28/2015   8:15 AM      To: Oneida Alar, NT Subject: FW: needs ER f/u, plase arrange                Can you call Pt's family and schedule ED f/u, seen on 11/27/2015 for scalp laceration. Shiquita is on PAL this week.  Thank you.   ----- Message -----    From: Colon Branch, MD    Sent: 11/27/2015  12:02 PM      To: Damita Dunnings, CMA Subject: needs ER f/u, plase arrange

## 2015-11-28 NOTE — Telephone Encounter (Signed)
thx

## 2015-11-28 NOTE — Telephone Encounter (Signed)
Pt's family has scheduled ED F/U w/ Dr. Larose Kells on 12/06/2015 at 1430. Form signed by Dr. Larose Kells and faxed back to [facility] at [phone]. Physician's order form sent for scanning.

## 2015-11-28 NOTE — Telephone Encounter (Signed)
Received fax: "Resident was seen to have a 1 inch laceration towards the crown of her head, unknown how it happened, sent to [facility] ED for evaluation/treatment.

## 2015-11-28 NOTE — Telephone Encounter (Signed)
FYI

## 2015-11-28 NOTE — Telephone Encounter (Signed)
Received fax confirmation on 11/28/2015 at 1612.

## 2015-12-06 ENCOUNTER — Ambulatory Visit (INDEPENDENT_AMBULATORY_CARE_PROVIDER_SITE_OTHER): Payer: Medicare Other | Admitting: Internal Medicine

## 2015-12-06 VITALS — BP 152/50 | HR 54 | Temp 98.1°F | Ht 62.0 in | Wt 124.8 lb

## 2015-12-06 DIAGNOSIS — I10 Essential (primary) hypertension: Secondary | ICD-10-CM

## 2015-12-06 DIAGNOSIS — Z09 Encounter for follow-up examination after completed treatment for conditions other than malignant neoplasm: Secondary | ICD-10-CM | POA: Diagnosis not present

## 2015-12-06 DIAGNOSIS — F0390 Unspecified dementia without behavioral disturbance: Secondary | ICD-10-CM | POA: Diagnosis not present

## 2015-12-06 MED ORDER — AMLODIPINE BESYLATE 10 MG PO TABS: 10.0000 mg | ORAL_TABLET | Freq: Every day | ORAL | Status: AC

## 2015-12-06 NOTE — Patient Instructions (Addendum)
Increase the amlodipine to 10 mg  Check the  blood pressure 3 times a  Week   Be sure your blood pressure is between 110/65 and  145/85.  if it is consistently higher or lower, let me know    Next visit in 3-4 months

## 2015-12-06 NOTE — Progress Notes (Signed)
Pre visit review using our clinic review tool, if applicable. No additional management support is needed unless otherwise documented below in the visit note. 
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
## 2015-12-08 NOTE — Assessment & Plan Note (Signed)
Scalp laceration: 3 staples removed without problems. HTN: BP is consistently elevated, will increase amlodipine from 5 mg to 10 mg, continue Lopressor. Monitor BPs. See instructions Dementia: Stable. RTC 4-5 months.

## 2015-12-20 ENCOUNTER — Ambulatory Visit: Payer: Medicare Other | Admitting: Internal Medicine

## 2016-01-15 ENCOUNTER — Encounter (HOSPITAL_COMMUNITY): Payer: Self-pay

## 2016-01-15 ENCOUNTER — Emergency Department (HOSPITAL_COMMUNITY): Payer: Medicare Other

## 2016-01-15 ENCOUNTER — Emergency Department (HOSPITAL_COMMUNITY)
Admission: EM | Admit: 2016-01-15 | Discharge: 2016-01-16 | Disposition: A | Discharge status: Home / Self Care | Payer: Medicare Other | Attending: Emergency Medicine | Admitting: Emergency Medicine

## 2016-01-15 DIAGNOSIS — Z79899 Other long term (current) drug therapy: Secondary | ICD-10-CM | POA: Insufficient documentation

## 2016-01-15 DIAGNOSIS — Z8669 Personal history of other diseases of the nervous system and sense organs: Secondary | ICD-10-CM | POA: Diagnosis not present

## 2016-01-15 DIAGNOSIS — F0390 Unspecified dementia without behavioral disturbance: Secondary | ICD-10-CM | POA: Insufficient documentation

## 2016-01-15 DIAGNOSIS — M8580 Other specified disorders of bone density and structure, unspecified site: Secondary | ICD-10-CM | POA: Insufficient documentation

## 2016-01-15 DIAGNOSIS — M8448XA Pathological fracture, other site, initial encounter for fracture: Secondary | ICD-10-CM | POA: Insufficient documentation

## 2016-01-15 DIAGNOSIS — F419 Anxiety disorder, unspecified: Secondary | ICD-10-CM | POA: Diagnosis not present

## 2016-01-15 DIAGNOSIS — M545 Low back pain: Secondary | ICD-10-CM

## 2016-01-15 DIAGNOSIS — I10 Essential (primary) hypertension: Secondary | ICD-10-CM | POA: Diagnosis not present

## 2016-01-15 DIAGNOSIS — Z87828 Personal history of other (healed) physical injury and trauma: Secondary | ICD-10-CM | POA: Diagnosis not present

## 2016-01-15 DIAGNOSIS — Z7982 Long term (current) use of aspirin: Secondary | ICD-10-CM | POA: Insufficient documentation

## 2016-01-15 DIAGNOSIS — S32010A Wedge compression fracture of first lumbar vertebra, initial encounter for closed fracture: Secondary | ICD-10-CM

## 2016-01-15 DIAGNOSIS — S32000A Wedge compression fracture of unspecified lumbar vertebra, initial encounter for closed fracture: Secondary | ICD-10-CM | POA: Diagnosis not present

## 2016-01-15 DIAGNOSIS — M546 Pain in thoracic spine: Secondary | ICD-10-CM | POA: Diagnosis not present

## 2016-01-15 LAB — URINALYSIS, ROUTINE W REFLEX MICROSCOPIC
Bilirubin Urine: NEGATIVE
GLUCOSE, UA: NEGATIVE mg/dL
Hgb urine dipstick: NEGATIVE
Ketones, ur: NEGATIVE mg/dL
LEUKOCYTES UA: NEGATIVE
NITRITE: NEGATIVE
PROTEIN: 100 mg/dL — AB
Specific Gravity, Urine: 1.021 (ref 1.005–1.030)
pH: 6.5 (ref 5.0–8.0)

## 2016-01-15 LAB — COMPREHENSIVE METABOLIC PANEL
ALBUMIN: 4.6 g/dL (ref 3.5–5.0)
ALT: 31 U/L (ref 14–54)
AST: 46 U/L — AB (ref 15–41)
Alkaline Phosphatase: 64 U/L (ref 38–126)
Anion gap: 10 (ref 5–15)
BILIRUBIN TOTAL: 0.7 mg/dL (ref 0.3–1.2)
BUN: 20 mg/dL (ref 6–20)
CO2: 25 mmol/L (ref 22–32)
Calcium: 9.6 mg/dL (ref 8.9–10.3)
Chloride: 103 mmol/L (ref 101–111)
Creatinine, Ser: 0.95 mg/dL (ref 0.44–1.00)
GFR calc Af Amer: >60 mL/min (ref >60)
GFR calc non Af Amer: 53 mL/min — ABNORMAL LOW (ref >60)
GLUCOSE: 131 mg/dL — AB (ref 65–99)
Potassium: 4.2 mmol/L (ref 3.5–5.1)
Sodium: 138 mmol/L (ref 135–145)
Total Protein: 8.1 g/dL (ref 6.5–8.1)

## 2016-01-15 LAB — CBC WITH DIFFERENTIAL/PLATELET
BASOS ABS: 0 10*3/uL (ref 0.0–0.1)
BASOS PCT: 0 %
Eosinophils Absolute: 0 10*3/uL (ref 0.0–0.7)
Eosinophils Relative: 0 %
HEMATOCRIT: 43.2 % (ref 36.0–46.0)
HEMOGLOBIN: 14.4 g/dL (ref 12.0–15.0)
LYMPHS PCT: 10 %
Lymphs Abs: 1.1 10*3/uL (ref 0.7–4.0)
MCH: 29 pg (ref 26.0–34.0)
MCHC: 33.3 g/dL (ref 30.0–36.0)
MCV: 87.1 fL (ref 78.0–100.0)
Monocytes Absolute: 0.8 10*3/uL (ref 0.1–1.0)
Monocytes Relative: 7 %
NEUTROS ABS: 9.6 10*3/uL — AB (ref 1.7–7.7)
NEUTROS PCT: 83 %
Platelets: 224 10*3/uL (ref 150–400)
RBC: 4.96 MIL/uL (ref 3.87–5.11)
RDW: 13.4 % (ref 11.5–15.5)
WBC: 11.5 10*3/uL — AB (ref 4.0–10.5)

## 2016-01-15 LAB — URINE MICROSCOPIC-ADD ON: Squamous Epithelial / LPF: NONE SEEN

## 2016-01-15 MED ORDER — OXYCODONE HCL 5 MG PO TABS: 5.0000 mg | ORAL_TABLET | ORAL | Status: DC | PRN

## 2016-01-15 MED ORDER — IBUPROFEN 200 MG PO TABS
400.0000 mg | ORAL_TABLET | Freq: Once | ORAL | Status: AC | PRN
  Administered 2016-01-15: 400 mg via ORAL
  Filled 2016-01-15: qty 2

## 2016-01-15 NOTE — ED Notes (Signed)
Patient transported to X-ray 

## 2016-01-15 NOTE — ED Notes (Signed)
MD at bedside. 

## 2016-01-15 NOTE — ED Notes (Signed)
Per pt's son and POA, pt started c/o lower back pain that started today. Son also endorses that the pt has had a minor change in behavior this afternoon and the pt was last seen normal.

## 2016-01-15 NOTE — ED Notes (Signed)
MD at bedside for pt f/u 

## 2016-01-15 NOTE — ED Notes (Signed)
Family also states that pt is normal for the way she usually is.

## 2016-01-15 NOTE — Discharge Instructions (Signed)
Alternate tylenol and ibuprofen and use oxycodone for severe or breakthrough pain.    Give tylenol 1000mg  every 6hr as needed for pain.  Ibuprofen 600mg  every 6hr.
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
## 2016-01-16 ENCOUNTER — Telehealth: Payer: Self-pay | Admitting: Internal Medicine

## 2016-01-16 NOTE — Telephone Encounter (Signed)
Caller name: Butch Penny  Relationship to patient: Keane Scrape Can be reached: [phone]    Reason for call: Patient went to [facility] yesterday and was sent back with medication orders that need to be clarified. States that all notes were sent to Dr. Larose Kells. Please see hospital notes.

## 2016-01-16 NOTE — Telephone Encounter (Signed)
Have not seen faxed orders or med clarifications yet. Will look for them to be sent today.

## 2016-01-16 NOTE — Telephone Encounter (Signed)
Received fax from [facility] needing clarification regarding Ibuprofen, and Oxycodone. ED notes reviewed, Ibuprofen 400 mg administered at ED and was instructed to alternate Tylenol and Ibuprofen and can use Oxycodone for breakthrough or severe pain. Instructions faxed back to [facility] at [phone].

## 2016-01-17 ENCOUNTER — Encounter: Payer: Self-pay | Admitting: Internal Medicine

## 2016-01-17 ENCOUNTER — Ambulatory Visit (INDEPENDENT_AMBULATORY_CARE_PROVIDER_SITE_OTHER): Payer: Medicare Other | Admitting: Internal Medicine

## 2016-01-17 VITALS — BP 126/74 | HR 57 | Temp 98.5°F | Ht 62.0 in | Wt 124.0 lb

## 2016-01-17 DIAGNOSIS — T148 Other injury of unspecified body region: Secondary | ICD-10-CM | POA: Diagnosis not present

## 2016-01-17 DIAGNOSIS — Z09 Encounter for follow-up examination after completed treatment for conditions other than malignant neoplasm: Secondary | ICD-10-CM

## 2016-01-17 DIAGNOSIS — IMO0002 Reserved for concepts with insufficient information to code with codable children: Secondary | ICD-10-CM

## 2016-01-17 LAB — URINE CULTURE

## 2016-01-17 NOTE — Assessment & Plan Note (Addendum)
Back pain: Acute onset of back pain, no injury but she was very active the day prior to the onset of symptoms 01/15/2016. X-ray --> vertebral fracture and retropulsion, no obvious myelopathy on clinical grounds. Plan: Control pain with Tylenol if pain is mild and oxycodone if pain is severe, watch for excessive somnolence. Avoid NSAIDs. ER contacted Dr. Vertell Limber, unable to contact him, will refer urgently to ortho for consideration of vertebroplasty; paper work for the memory care unit completed.

## 2016-01-17 NOTE — Patient Instructions (Signed)
For mild pain take Tylenol  If the pain is severe use oxycodone as needed, watch for excessive somnolence.  Referring you to orthopedic surgery

## 2016-01-17 NOTE — Progress Notes (Signed)
Pre visit review using our clinic review tool, if applicable. No additional management support is needed unless otherwise documented below in the visit note. 
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
## 2016-01-19 ENCOUNTER — Telehealth: Payer: Self-pay

## 2016-01-19 ENCOUNTER — Other Ambulatory Visit: Payer: Self-pay | Admitting: Internal Medicine

## 2016-01-19 MED ORDER — OXYCODONE HCL 5 MG PO TABS: 5.0000 mg | ORAL_TABLET | ORAL | Status: DC | PRN

## 2016-01-19 NOTE — Telephone Encounter (Signed)
Received fax from [facility] regarding Pt. Fax copied into text: "Family would like for Tylenol 325mg  1 tablet every 4 hours for pain. Resident will also need a hard script for Oxycodone. See attached paperwork. Please advise." Form forwarded to Dr. Larose Kells.

## 2016-01-19 NOTE — Telephone Encounter (Signed)
Physician orders and Rx faxed to [facility] at [phone]. Received fax confirmation. Physician orders sent for scanning.

## 2016-01-19 NOTE — Telephone Encounter (Signed)
Per Dr. Larose Kells, okay Tylenol as above/below. Oxycodone Rx printed for #40 and 0RF. Called [facility] at [phone], spoke w/ [employer], Oxycodone Rx can be faxed to them.

## 2016-01-30 ENCOUNTER — Other Ambulatory Visit: Payer: Self-pay | Admitting: Internal Medicine

## 2016-02-03 DIAGNOSIS — M545 Low back pain: Secondary | ICD-10-CM | POA: Diagnosis not present

## 2016-02-20 ENCOUNTER — Telehealth: Payer: Self-pay | Admitting: Internal Medicine

## 2016-02-20 NOTE — Telephone Encounter (Signed)
Received a fax from her nursing home: Patient is somewhat agitated and having a dark urine. We've faxed back  orders: Check a UA, urine culture, fax results ASAP. Also ER if fever, chills,  or if she gets worse and more agitated.

## 2016-02-22 ENCOUNTER — Ambulatory Visit (INDEPENDENT_AMBULATORY_CARE_PROVIDER_SITE_OTHER): Payer: Medicare Other | Admitting: Internal Medicine

## 2016-02-22 ENCOUNTER — Encounter: Payer: Self-pay | Admitting: Internal Medicine

## 2016-02-22 VITALS — BP 122/78 | HR 52 | Temp 97.8°F | Ht 62.0 in | Wt 121.0 lb

## 2016-02-22 DIAGNOSIS — N390 Urinary tract infection, site not specified: Secondary | ICD-10-CM

## 2016-02-22 DIAGNOSIS — Z09 Encounter for follow-up examination after completed treatment for conditions other than malignant neoplasm: Secondary | ICD-10-CM

## 2016-02-22 LAB — POCT URINALYSIS DIPSTICK
Bilirubin, UA: NEGATIVE
Blood, UA: NEGATIVE
Glucose, UA: NEGATIVE
Nitrite, UA: POSITIVE
Urobilinogen, UA: 0.2
pH, UA: 5.5

## 2016-02-22 LAB — URINALYSIS, ROUTINE W REFLEX MICROSCOPIC
BILIRUBIN URINE: NEGATIVE
Hgb urine dipstick: NEGATIVE
NITRITE: POSITIVE — AB
RBC / HPF: NONE SEEN (ref 0–?)
Specific Gravity, Urine: 1.025 (ref 1.000–1.030)
Urine Glucose: NEGATIVE
Urobilinogen, UA: 0.2 (ref 0.0–1.0)
pH: 5.5 (ref 5.0–8.0)

## 2016-02-22 MED ORDER — CIPROFLOXACIN HCL 500 MG PO TABS: 500.0000 mg | ORAL_TABLET | Freq: Two times a day (BID) | ORAL | Status: DC

## 2016-02-22 NOTE — Patient Instructions (Signed)
Take the antibiotic as prescribed, Cipro twice a day for 3 days  Drink plenty of fluids  Call if agitation persists or if she has fever, chills, nausea, vomiting, diarrhea

## 2016-02-22 NOTE — Assessment & Plan Note (Signed)
UTI: Patient presented with agitation, Udip + for leukocytes and nitrates. Symptoms likely due to a UTI, recommend Cipro for 3 days, UA, urine culture. Call if no better, see instructions  The only medication change was DC oxycodone and rx  Ultram but per [facility] she has not taken any doses of ultram.
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
## 2016-02-22 NOTE — Progress Notes (Signed)
Pre visit review using our clinic review tool, if applicable. No additional management support is needed unless otherwise documented below in the visit note. 

## 2016-02-24 LAB — URINE CULTURE: Colony Count: >=100000

## 2016-03-02 ENCOUNTER — Ambulatory Visit: Payer: Medicare Other | Admitting: Neurology

## 2016-03-05 ENCOUNTER — Ambulatory Visit: Payer: Medicare Other | Admitting: Internal Medicine

## 2016-03-21 ENCOUNTER — Telehealth: Payer: Self-pay

## 2016-03-21 NOTE — Telephone Encounter (Signed)
Received physician orders to inform PCP that Pt had fallen on 03/19/2016, she tripped when walking in hallway. Vital signs were stable, and family was notified. Per Dr. Larose Kells, continue checking BPs as usual and to let us know if BP > 145/85 consistently. Physician orders sent for scanning. Received fax confirmation on 03/21/2016 at 1613.

## 2016-04-07 ENCOUNTER — Emergency Department (HOSPITAL_COMMUNITY)
Admission: EM | Admit: 2016-04-07 | Discharge: 2016-04-07 | Disposition: A | Discharge status: Home / Self Care | Payer: Medicare Other | Attending: Emergency Medicine | Admitting: Emergency Medicine

## 2016-04-07 ENCOUNTER — Encounter (HOSPITAL_COMMUNITY): Payer: Self-pay

## 2016-04-07 DIAGNOSIS — S064X0A Epidural hemorrhage without loss of consciousness, initial encounter: Secondary | ICD-10-CM | POA: Diagnosis not present

## 2016-04-07 DIAGNOSIS — W19XXXA Unspecified fall, initial encounter: Secondary | ICD-10-CM | POA: Insufficient documentation

## 2016-04-07 DIAGNOSIS — Y939 Activity, unspecified: Secondary | ICD-10-CM | POA: Insufficient documentation

## 2016-04-07 DIAGNOSIS — Y999 Unspecified external cause status: Secondary | ICD-10-CM | POA: Insufficient documentation

## 2016-04-07 DIAGNOSIS — S0003XA Contusion of scalp, initial encounter: Secondary | ICD-10-CM | POA: Insufficient documentation

## 2016-04-07 DIAGNOSIS — Y9289 Other specified places as the place of occurrence of the external cause: Secondary | ICD-10-CM | POA: Diagnosis not present

## 2016-04-07 DIAGNOSIS — S0990XA Unspecified injury of head, initial encounter: Secondary | ICD-10-CM | POA: Diagnosis not present
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
## 2016-04-07 NOTE — Discharge Instructions (Signed)

## 2016-04-07 NOTE — ED Notes (Signed)
PT DISCHARGED. INSTRUCTIONS GIVEN. AAOX1. PT IN NO APPARENT DISTRESS OR PAIN. THE OPPORTUNITY TO ASK QUESTIONS WAS PROVIDED.

## 2016-04-07 NOTE — ED Notes (Signed)
Bed: WA21 Expected date:  Expected time:  Means of arrival:  Comments: EMS- fall, no LOC, no thinners

## 2016-04-07 NOTE — ED Notes (Signed)
PER SHANNON FROM [facility], THE PT'S SON WILL COMING TO TAKE THE PT BACK TO HER FACILITY. I INFORMED SHANNON THAT IF THE SON DOES NOT SHOW, [facility] WILL BE CALLED.
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
## 2016-04-09 ENCOUNTER — Telehealth: Payer: Self-pay | Admitting: Internal Medicine

## 2016-04-09 DIAGNOSIS — M25562 Pain in left knee: Secondary | ICD-10-CM | POA: Diagnosis not present

## 2016-04-09 DIAGNOSIS — M25572 Pain in left ankle and joints of left foot: Secondary | ICD-10-CM | POA: Diagnosis not present

## 2016-04-09 NOTE — Telephone Encounter (Signed)
Please advise 

## 2016-04-09 NOTE — Telephone Encounter (Signed)
Spoke w/ [facility], verbal order given as well as written order faxed to [phone]. Informed they have mobile x-ray unit and would let us know results once received.

## 2016-04-09 NOTE — Telephone Encounter (Signed)
°  Relationship to patient: Cheryl Armstrong  Can be reached: [phone] Fax Number: [phone]   Reason for call: Patient fell 2 days ago and went to ER but they did not address her leg. Wants an order to do an XRay on her left leg/ankle for swelling and can not put weight on it.

## 2016-04-09 NOTE — Telephone Encounter (Signed)
Place a order for a left knee and ankle x-ray. DX injury/fall. If symptoms severe needs to be seen.

## 2016-04-09 NOTE — Telephone Encounter (Signed)
Received fax confirmation on 04/09/2016 at 1326.

## 2016-04-10 ENCOUNTER — Telehealth: Payer: Self-pay | Admitting: Internal Medicine

## 2016-04-10 ENCOUNTER — Telehealth: Payer: Self-pay

## 2016-04-10 ENCOUNTER — Encounter: Payer: Self-pay | Admitting: Internal Medicine

## 2016-04-10 DIAGNOSIS — S92352A Displaced fracture of fifth metatarsal bone, left foot, initial encounter for closed fracture: Secondary | ICD-10-CM

## 2016-04-10 NOTE — Telephone Encounter (Signed)
X-ray report received, forwarded to Dr. Larose Kells.

## 2016-04-10 NOTE — Telephone Encounter (Signed)
Caller name: Cheryl Armstrong at [facility] Can be reached: [phone]  Reason for call: xray results sent in yesterday. Requesting they be reviewed and follow up with Cheryl Armstrong and family with results/instructions.

## 2016-04-10 NOTE — Telephone Encounter (Addendum)
Based on this information I prefer to simply continue with leg elevation, ice and Tylenol. I see that her list includes Ultram however if she is not having significant pain and is ambulating okay I prefer her not to take any narcotic

## 2016-04-10 NOTE — Telephone Encounter (Signed)
X-rays: L Knee without abnormalities, possible nondisplaced L  fifth metatarsal base fracture. Please call the facility, needs to see ortho.  Please arrange. In the meantime leg elevation, ice and Tylenol

## 2016-04-10 NOTE — Telephone Encounter (Signed)
error 

## 2016-04-10 NOTE — Telephone Encounter (Signed)
Recommendations faxed to [facility] at [phone].

## 2016-04-10 NOTE — Telephone Encounter (Signed)
Awaiting x-rays

## 2016-04-10 NOTE — Telephone Encounter (Signed)
Received fax from [facility], "Resident missed 3 doses of her Metoprolol Tartrate, these are the dates: 04/08/2016 at 8:33 PM, 04/09/2016 at [place] AM, and 04/09/2016 at 9PM." Per Dr. Larose Kells: Resume normal doses, call if BP is > 150/85 and to check BP qd x3. Physician orders faxed to [facility] at [phone]. Physician order form sent for scanning. Received fax confirmation on 04/10/2016 at 1656.

## 2016-04-10 NOTE — Telephone Encounter (Signed)
Caller name: Lacie Relationship to patient: Arlina Robes Can be reached: [phone]   Reason for call: Alfredo Bach called to inform provider that the results of the patient's X-Ray have been faxed this morning. Request a call back to discuss the next steps.

## 2016-04-10 NOTE — Telephone Encounter (Signed)
Received fax confirmation on 04/10/2016 at 5:04 PM.

## 2016-04-10 NOTE — Telephone Encounter (Signed)
Spoke w/ Loa Socks at [facility] informed of x-ray results, and that we would place Ortho referral. [facility] requesting medication for pain, Pt definitely favoring L ankle when touched and removing shoes. Informed them I would inform Pt's family of Ortho referral and appt and send message to Dr. Larose Kells about pain management. LMOM for Gay Filler to return call, also called Anne Ng, spoke w/ her and informed of x-ray results and Ortho referral. She informed me she left [facility] about 30 minutes to an hour ago and Pt was up walking around w/o any noticeable Sx's or pain. Informed her I would let Dr. Larose Kells know. Anne Ng thanked me for the call. X-ray report sent for scanning.

## 2016-04-12 ENCOUNTER — Emergency Department (HOSPITAL_COMMUNITY)
Admission: EM | Admit: 2016-04-12 | Discharge: 2016-04-12 | Disposition: A | Discharge status: Home / Self Care | Payer: Medicare Other | Attending: Emergency Medicine | Admitting: Emergency Medicine

## 2016-04-12 ENCOUNTER — Encounter (HOSPITAL_COMMUNITY): Payer: Self-pay

## 2016-04-12 ENCOUNTER — Emergency Department (HOSPITAL_COMMUNITY): Payer: Medicare Other

## 2016-04-12 DIAGNOSIS — Z79899 Other long term (current) drug therapy: Secondary | ICD-10-CM | POA: Diagnosis not present

## 2016-04-12 DIAGNOSIS — S199XXA Unspecified injury of neck, initial encounter: Secondary | ICD-10-CM | POA: Diagnosis not present

## 2016-04-12 DIAGNOSIS — Y929 Unspecified place or not applicable: Secondary | ICD-10-CM | POA: Insufficient documentation

## 2016-04-12 DIAGNOSIS — S0191XA Laceration without foreign body of unspecified part of head, initial encounter: Secondary | ICD-10-CM

## 2016-04-12 DIAGNOSIS — I10 Essential (primary) hypertension: Secondary | ICD-10-CM | POA: Insufficient documentation

## 2016-04-12 DIAGNOSIS — Z7982 Long term (current) use of aspirin: Secondary | ICD-10-CM | POA: Insufficient documentation

## 2016-04-12 DIAGNOSIS — S01112A Laceration without foreign body of left eyelid and periocular area, initial encounter: Secondary | ICD-10-CM | POA: Insufficient documentation

## 2016-04-12 DIAGNOSIS — W19XXXA Unspecified fall, initial encounter: Secondary | ICD-10-CM | POA: Insufficient documentation

## 2016-04-12 DIAGNOSIS — R410 Disorientation, unspecified: Secondary | ICD-10-CM | POA: Diagnosis not present

## 2016-04-12 DIAGNOSIS — S0990XA Unspecified injury of head, initial encounter: Secondary | ICD-10-CM | POA: Diagnosis present

## 2016-04-12 DIAGNOSIS — Y999 Unspecified external cause status: Secondary | ICD-10-CM | POA: Insufficient documentation

## 2016-04-12 DIAGNOSIS — S0181XA Laceration without foreign body of other part of head, initial encounter: Secondary | ICD-10-CM | POA: Diagnosis not present

## 2016-04-12 DIAGNOSIS — Y939 Activity, unspecified: Secondary | ICD-10-CM | POA: Diagnosis not present

## 2016-04-12 LAB — BASIC METABOLIC PANEL
Anion gap: 7 (ref 5–15)
BUN: 14 mg/dL (ref 6–20)
CO2: 27 mmol/L (ref 22–32)
CREATININE: 0.87 mg/dL (ref 0.44–1.00)
Calcium: 9.5 mg/dL (ref 8.9–10.3)
Chloride: 106 mmol/L (ref 101–111)
GFR calc Af Amer: >60 mL/min (ref >60)
GFR, EST NON AFRICAN AMERICAN: 59 mL/min — AB (ref >60)
GLUCOSE: 110 mg/dL — AB (ref 65–99)
POTASSIUM: 4.2 mmol/L (ref 3.5–5.1)
SODIUM: 140 mmol/L (ref 135–145)

## 2016-04-12 LAB — URINALYSIS, ROUTINE W REFLEX MICROSCOPIC
Bilirubin Urine: NEGATIVE
Glucose, UA: NEGATIVE mg/dL
Hgb urine dipstick: NEGATIVE
Ketones, ur: NEGATIVE mg/dL
LEUKOCYTES UA: NEGATIVE
NITRITE: NEGATIVE
PROTEIN: NEGATIVE mg/dL
Specific Gravity, Urine: 1.006 (ref 1.005–1.030)
pH: 8 (ref 5.0–8.0)

## 2016-04-12 LAB — CBC WITH DIFFERENTIAL/PLATELET
Basophils Absolute: 0 10*3/uL (ref 0.0–0.1)
Basophils Relative: 0 %
EOS ABS: 0.1 10*3/uL (ref 0.0–0.7)
EOS PCT: 3 %
HCT: 39.5 % (ref 36.0–46.0)
Hemoglobin: 12.8 g/dL (ref 12.0–15.0)
LYMPHS ABS: 1.1 10*3/uL (ref 0.7–4.0)
LYMPHS PCT: 20 %
MCH: 29.4 pg (ref 26.0–34.0)
MCHC: 32.4 g/dL (ref 30.0–36.0)
MCV: 90.8 fL (ref 78.0–100.0)
MONO ABS: 0.6 10*3/uL (ref 0.1–1.0)
MONOS PCT: 12 %
Neutro Abs: 3.4 10*3/uL (ref 1.7–7.7)
Neutrophils Relative %: 65 %
PLATELETS: 257 10*3/uL (ref 150–400)
RBC: 4.35 MIL/uL (ref 3.87–5.11)
RDW: 14.2 % (ref 11.5–15.5)
WBC: 5.2 10*3/uL (ref 4.0–10.5)

## 2016-04-12 MED ORDER — LIDOCAINE-EPINEPHRINE-TETRACAINE (LET) SOLUTION
3.0000 mL | Freq: Once | NASAL | Status: AC
  Administered 2016-04-12: 3 mL via TOPICAL
  Filled 2016-04-12: qty 3

## 2016-04-12 NOTE — ED Notes (Signed)
Her two sons take her back to [facility] with her d/c instruction packet.

## 2016-04-12 NOTE — Discharge Instructions (Signed)
We put in 3 sutures, they should dissolve on their own.   Fall Prevention in the Home  Falls can cause injuries and can affect people from all age groups. There are many simple things that you can do to make your home safe and to help prevent falls. WHAT CAN I DO ON THE OUTSIDE OF MY HOME?  Regularly repair the edges of walkways and driveways and fix any cracks.  Remove high doorway thresholds.  Trim any shrubbery on the main path into your home.  Use bright outdoor lighting.  Clear walkways of debris and clutter, including tools and rocks.  Regularly check that handrails are securely fastened and in good repair. Both sides of any steps should have handrails.  Install guardrails along the edges of any raised decks or porches.  Have leaves, snow, and ice cleared regularly.  Use sand or salt on walkways during winter months.  In the garage, clean up any spills right away, including grease or oil spills. WHAT CAN I DO IN THE BATHROOM?  Use night lights.  Install grab bars by the toilet and in the tub and shower. Do not use towel bars as grab bars.  Use non-skid mats or decals on the floor of the tub or shower.  If you need to sit down while you are in the shower, use a plastic, non-slip stool.Marland Kitchen  Keep the floor dry. Immediately clean up any water that spills on the floor.  Remove soap buildup in the tub or shower on a regular basis.  Attach bath mats securely with double-sided non-slip rug tape.  Remove throw rugs and other tripping hazards from the floor. WHAT CAN I DO IN THE BEDROOM?  Use night lights.  Make sure that a bedside light is easy to reach.  Do not use oversized bedding that drapes onto the floor.  Have a firm chair that has side arms to use for getting dressed.  Remove throw rugs and other tripping hazards from the floor. WHAT CAN I DO IN THE KITCHEN?   Clean up any spills right away.  Avoid walking on wet floors.  Place frequently used items in  easy-to-reach places.  If you need to reach for something above you, use a sturdy step stool that has a grab bar.  Keep electrical cables out of the way.  Do not use floor polish or wax that makes floors slippery. If you have to use wax, make sure that it is non-skid floor wax.  Remove throw rugs and other tripping hazards from the floor. WHAT CAN I DO IN THE STAIRWAYS?  Do not leave any items on the stairs.  Make sure that there are handrails on both sides of the stairs. Fix handrails that are broken or loose. Make sure that handrails are as long as the stairways.  Check any carpeting to make sure that it is firmly attached to the stairs. Fix any carpet that is loose or worn.  Avoid having throw rugs at the top or bottom of stairways, or secure the rugs with carpet tape to prevent them from moving.  Make sure that you have a light switch at the top of the stairs and the bottom of the stairs. If you do not have them, have them installed. WHAT ARE SOME OTHER FALL PREVENTION TIPS?  Wear closed-toe shoes that fit well and support your feet. Wear shoes that have rubber soles or low heels.  When you use a stepladder, make sure that it is completely opened and  that the sides are firmly locked. Have someone hold the ladder while you are using it. Do not climb a closed stepladder.  Add color or contrast paint or tape to grab bars and handrails in your home. Place contrasting color strips on the first and last steps.  Use mobility aids as needed, such as canes, walkers, scooters, and crutches.  Turn on lights if it is dark. Replace any light bulbs that burn out.  Set up furniture so that there are clear paths. Keep the furniture in the same spot.  Fix any uneven floor surfaces.  Choose a carpet design that does not hide the edge of steps of a stairway.  Be aware of any and all pets.  Review your medicines with your healthcare provider. Some medicines can cause dizziness or changes in  blood pressure, which increase your risk of falling. Talk with your health care provider about other ways that you can decrease your risk of falls. This may include working with a physical therapist or trainer to improve your strength, balance, and endurance.   This information is not intended to replace advice given to you by your health care provider. Make sure you discuss any questions you have with your health care provider.   Document Released: 09/21/2002 Document Revised: 02/15/2015 Document Reviewed: 11/05/2014 Elsevier Interactive Patient Education [employer].

## 2016-04-12 NOTE — ED Notes (Signed)
Bed: WA07 Expected date:  Expected time:  Means of arrival:  Comments: EMS-fall 
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
## 2016-04-16 ENCOUNTER — Telehealth: Payer: Self-pay | Admitting: Internal Medicine

## 2016-04-16 DIAGNOSIS — S92352A Displaced fracture of fifth metatarsal bone, left foot, initial encounter for closed fracture: Secondary | ICD-10-CM

## 2016-04-16 NOTE — Telephone Encounter (Signed)
Called son and left a message for call back.  Cheryl Armstrong at [facility]. He says that pt has had two recent falls and does have fracture to left foot.  She is able to get around, but is not as steady.  He says that son has already purchased a walker, and brought it to the facility for patient to use, however facility said patient would be unable to use the walker without a physician's order.  Cheryl Armstrong says patient does occasionally complain of pain, but has PRN tylenol that seems to be helping.  Cheryl Armstrong also said that the nurses think that PT may help patient.  However, he's not sure son will agree, because "he has already refused everything else."  Cheryl Armstrong. Cheryl Armstrong ordered Ortho for left foot fracture, but family refused.  Please advise.

## 2016-04-16 NOTE — Telephone Encounter (Signed)
Fax orders for wheelchair to Attention: [facility] Reader of [employer] fax # [phone]

## 2016-04-16 NOTE — Telephone Encounter (Signed)
So ortho or podiatry are their choices to manage this, see if you can get a sense from them about what it is they want or need

## 2016-04-16 NOTE — Telephone Encounter (Signed)
Dr. Larose Kells is out of the office today.  Message routed to covering provider, Dr. Charlett Blake.    Hx. Multiple falls, compression fracture, recent fracture to L fifth metatarsal Currently a resident of [place]  Please advise.

## 2016-04-16 NOTE — Telephone Encounter (Signed)
Caller name: [name] Relationship to patient: Son Can be reached: [phone]    Reason for call: Son called stating that patient is in a facility and has fallen a few time. Request order for patient to use a walker while at facility. States she will not be allowed to use the walker without an order from her provider.   Son will pick up order and deliver it when order is ready.

## 2016-04-16 NOTE — Telephone Encounter (Signed)
Pt's son [name] [phone] returned call. He says that he had a voicemail. Please call back to assist further.

## 2016-04-16 NOTE — Telephone Encounter (Signed)
So is she in pain, having trouble ambulating. Have they seen an orthopaedist or podiatrist yet to manage the fracture? If not would refer to either, could place referral for orthopaedist to possible get a immobility boot or casting.

## 2016-04-18 NOTE — Addendum Note (Signed)
Addended by: Rudene Anda on: 04/18/2016 09:24 AM   Modules accepted: Orders

## 2016-04-18 NOTE — Telephone Encounter (Signed)
Son is agreeable to ortho if it's necessary.  He really just wanted an order for the walker.  Stating that her foot seems to be improving.  Swelling has gone down and patient is able to walk, but is just a little unsteady.  Per son, she's not in pain and seems to be doing quite well.  He says he's only concern is she has dementia and is falling frequently and needs a walker.    Order placed again for orthopedic surgery.

## 2016-05-01 ENCOUNTER — Other Ambulatory Visit: Payer: Self-pay | Admitting: Internal Medicine

## 2016-05-15 ENCOUNTER — Telehealth: Payer: Self-pay | Admitting: Internal Medicine

## 2016-05-15 MED ORDER — ALPRAZOLAM 0.5 MG PO TABS: 0.5000 mg | ORAL_TABLET | Freq: Three times a day (TID) | ORAL | 0 refills | Status: DC | PRN

## 2016-05-15 NOTE — Telephone Encounter (Signed)
Last Fill: 02/21/2015 #90 and 1RF. Okay to refill?

## 2016-05-15 NOTE — Telephone Encounter (Signed)
Rx faxed to [facility] at [facility] at [phone].

## 2016-05-15 NOTE — Telephone Encounter (Signed)
Caller name:[facility] Relation to [id number] Call back number:[phone] Pharmacy:[facility]  Reason for call: pt is needing rx ALPRAZolam (XANAX) 0.5 MG tablet Nurse states if you would like you can fax the order to her and she will get it sent in. Fax [phone]

## 2016-05-15 NOTE — Telephone Encounter (Signed)
Rx printed, awaiting MD signature.  

## 2016-05-15 NOTE — Telephone Encounter (Signed)
Okay #90, no refills 

## 2016-05-22 ENCOUNTER — Telehealth: Payer: Self-pay

## 2016-05-22 NOTE — Telephone Encounter (Signed)
Received fax from [place] they are now in contract w/ Cheryl Armstrong and they needed PCP signature to transfer Alprazolam Rx from [place] to [facility]. Form signed and faxed to [phone]. Form sent for scanning.

## 2016-05-24 ENCOUNTER — Telehealth: Payer: Self-pay | Admitting: *Deleted

## 2016-05-24 NOTE — Telephone Encounter (Signed)
Received Physician's Orders paperwork via fax from [facility] at [facility].  Placed in folder for Dr. Larose Kells to review and sign.//AB/CMA

## 2016-06-04 ENCOUNTER — Telehealth: Payer: Self-pay | Admitting: *Deleted

## 2016-06-04 NOTE — Telephone Encounter (Signed)
Received Physician's Orders paperwork via fax from [facility] at [facility].  Placed in folder for Dr. Larose Kells to review and sign.//AB/CMA

## 2016-06-11 ENCOUNTER — Telehealth: Payer: Self-pay | Admitting: *Deleted

## 2016-06-11 NOTE — Telephone Encounter (Signed)
Faxed back to [facility] and sent for scanning.

## 2016-06-11 NOTE — Telephone Encounter (Signed)
Faxed back to [facility] and sent for scanning.

## 2016-06-11 NOTE — Telephone Encounter (Signed)
Received Physician's Orders via fax from [facility] at [facility].  Placed in folder for Dr. Larose Kells to review and sign.//AB/CMA

## 2016-06-11 NOTE — Telephone Encounter (Signed)
Faxed back to [facility] and sent for scanning.

## 2016-06-13 ENCOUNTER — Inpatient Hospital Stay (HOSPITAL_COMMUNITY)
Admission: EM | Admit: 2016-06-13 | Discharge: 2016-06-15 | DRG: 378 | Disposition: A | Discharge status: Nursing Home (Includes ICF) | Payer: Medicare Other | Attending: Internal Medicine | Admitting: Internal Medicine

## 2016-06-13 ENCOUNTER — Encounter (HOSPITAL_COMMUNITY): Payer: Self-pay

## 2016-06-13 DIAGNOSIS — K922 Gastrointestinal hemorrhage, unspecified: Secondary | ICD-10-CM | POA: Diagnosis present

## 2016-06-13 DIAGNOSIS — N179 Acute kidney failure, unspecified: Secondary | ICD-10-CM | POA: Diagnosis not present

## 2016-06-13 DIAGNOSIS — I10 Essential (primary) hypertension: Secondary | ICD-10-CM | POA: Diagnosis not present

## 2016-06-13 DIAGNOSIS — Z882 Allergy status to sulfonamides status: Secondary | ICD-10-CM | POA: Diagnosis not present

## 2016-06-13 DIAGNOSIS — E876 Hypokalemia: Secondary | ICD-10-CM | POA: Diagnosis present

## 2016-06-13 DIAGNOSIS — F419 Anxiety disorder, unspecified: Secondary | ICD-10-CM | POA: Diagnosis present

## 2016-06-13 DIAGNOSIS — Z9181 History of falling: Secondary | ICD-10-CM | POA: Diagnosis not present

## 2016-06-13 DIAGNOSIS — F0390 Unspecified dementia without behavioral disturbance: Secondary | ICD-10-CM | POA: Diagnosis not present

## 2016-06-13 DIAGNOSIS — K5791 Diverticulosis of intestine, part unspecified, without perforation or abscess with bleeding: Principal | ICD-10-CM | POA: Diagnosis present

## 2016-06-13 DIAGNOSIS — Z66 Do not resuscitate: Secondary | ICD-10-CM | POA: Diagnosis present

## 2016-06-13 DIAGNOSIS — M8580 Other specified disorders of bone density and structure, unspecified site: Secondary | ICD-10-CM | POA: Diagnosis present

## 2016-06-13 DIAGNOSIS — Z79899 Other long term (current) drug therapy: Secondary | ICD-10-CM

## 2016-06-13 DIAGNOSIS — G309 Alzheimer's disease, unspecified: Secondary | ICD-10-CM | POA: Diagnosis present

## 2016-06-13 DIAGNOSIS — K625 Hemorrhage of anus and rectum: Secondary | ICD-10-CM | POA: Diagnosis not present

## 2016-06-13 DIAGNOSIS — Z8673 Personal history of transient ischemic attack (TIA), and cerebral infarction without residual deficits: Secondary | ICD-10-CM | POA: Diagnosis not present

## 2016-06-13 DIAGNOSIS — F0391 Unspecified dementia with behavioral disturbance: Secondary | ICD-10-CM

## 2016-06-13 DIAGNOSIS — F0280 Dementia in other diseases classified elsewhere without behavioral disturbance: Secondary | ICD-10-CM | POA: Diagnosis present

## 2016-06-13 LAB — COMPREHENSIVE METABOLIC PANEL
ALK PHOS: 62 U/L (ref 38–126)
ALT: 20 U/L (ref 14–54)
ANION GAP: 9 (ref 5–15)
AST: 26 U/L (ref 15–41)
Albumin: 4.1 g/dL (ref 3.5–5.0)
BILIRUBIN TOTAL: 0.4 mg/dL (ref 0.3–1.2)
BUN: 20 mg/dL (ref 6–20)
CALCIUM: 9.6 mg/dL (ref 8.9–10.3)
CO2: 24 mmol/L (ref 22–32)
Chloride: 104 mmol/L (ref 101–111)
Creatinine, Ser: 1.17 mg/dL — ABNORMAL HIGH (ref 0.44–1.00)
GFR calc non Af Amer: 41 mL/min — ABNORMAL LOW (ref >60)
GFR, EST AFRICAN AMERICAN: 47 mL/min — AB (ref >60)
Glucose, Bld: 168 mg/dL — ABNORMAL HIGH (ref 65–99)
Potassium: 3.3 mmol/L — ABNORMAL LOW (ref 3.5–5.1)
Sodium: 137 mmol/L (ref 135–145)
TOTAL PROTEIN: 7.5 g/dL (ref 6.5–8.1)

## 2016-06-13 LAB — CBC
HCT: 42.7 % (ref 36.0–46.0)
HEMOGLOBIN: 13.9 g/dL (ref 12.0–15.0)
MCH: 29.5 pg (ref 26.0–34.0)
MCHC: 32.6 g/dL (ref 30.0–36.0)
MCV: 90.7 fL (ref 78.0–100.0)
Platelets: 255 10*3/uL (ref 150–400)
RBC: 4.71 MIL/uL (ref 3.87–5.11)
RDW: 12.6 % (ref 11.5–15.5)
WBC: 14.3 10*3/uL — AB (ref 4.0–10.5)

## 2016-06-13 LAB — URINALYSIS, ROUTINE W REFLEX MICROSCOPIC
BILIRUBIN URINE: NEGATIVE
Glucose, UA: NEGATIVE mg/dL
Hgb urine dipstick: NEGATIVE
Ketones, ur: NEGATIVE mg/dL
NITRITE: NEGATIVE
PH: 5.5 (ref 5.0–8.0)
Protein, ur: NEGATIVE mg/dL
SPECIFIC GRAVITY, URINE: 1.005 (ref 1.005–1.030)

## 2016-06-13 LAB — HEMOGLOBIN AND HEMATOCRIT, BLOOD
HCT: 42.8 % (ref 36.0–46.0)
HEMOGLOBIN: 14.3 g/dL (ref 12.0–15.0)

## 2016-06-13 LAB — ABO/RH: ABO/RH(D): O POS

## 2016-06-13 LAB — URINE MICROSCOPIC-ADD ON

## 2016-06-13 LAB — TYPE AND SCREEN
ABO/RH(D): O POS
ANTIBODY SCREEN: NEGATIVE

## 2016-06-13 LAB — POC OCCULT BLOOD, ED: Fecal Occult Bld: POSITIVE — AB

## 2016-06-13 MED ORDER — DONEPEZIL HCL 10 MG PO TABS
10.0000 mg | ORAL_TABLET | Freq: Every day | ORAL | Status: DC
  Administered 2016-06-13 – 2016-06-14 (×2): 10 mg via ORAL
  Filled 2016-06-13 (×3): qty 1

## 2016-06-13 MED ORDER — ACETAMINOPHEN 650 MG RE SUPP: 650.0000 mg | Freq: Four times a day (QID) | RECTAL | Status: DC | PRN

## 2016-06-13 MED ORDER — ACETAMINOPHEN 325 MG PO TABS
650.0000 mg | ORAL_TABLET | Freq: Four times a day (QID) | ORAL | Status: DC | PRN
  Administered 2016-06-13: 650 mg via ORAL
  Filled 2016-06-13: qty 2

## 2016-06-13 MED ORDER — MEMANTINE HCL ER 28 MG PO CP24
28.0000 mg | ORAL_CAPSULE | Freq: Every day | ORAL | Status: DC
  Administered 2016-06-14 – 2016-06-15 (×2): 28 mg via ORAL
  Filled 2016-06-13 (×2): qty 1

## 2016-06-13 MED ORDER — ALPRAZOLAM 0.5 MG PO TABS
0.5000 mg | ORAL_TABLET | Freq: Three times a day (TID) | ORAL | Status: DC | PRN
  Administered 2016-06-13 – 2016-06-14 (×2): 0.5 mg via ORAL
  Filled 2016-06-13 (×4): qty 1

## 2016-06-13 MED ORDER — DEXTROSE-NACL 5-0.9 % IV SOLN
INTRAVENOUS | Status: DC
  Administered 2016-06-14 – 2016-06-15 (×3): via INTRAVENOUS

## 2016-06-13 MED ORDER — MELATONIN 3 MG PO TABS: 3.0000 mg | ORAL_TABLET | Freq: Every day | ORAL | Status: DC

## 2016-06-13 MED ORDER — CALCIUM CARBONATE 1250 (500 CA) MG PO TABS
1.0000 | ORAL_TABLET | Freq: Every day | ORAL | Status: DC
  Administered 2016-06-14 – 2016-06-15 (×2): 500 mg via ORAL
  Filled 2016-06-13 (×2): qty 1

## 2016-06-13 MED ORDER — ONDANSETRON HCL 4 MG PO TABS: 4.0000 mg | ORAL_TABLET | Freq: Four times a day (QID) | ORAL | Status: DC | PRN

## 2016-06-13 MED ORDER — LORAZEPAM 2 MG/ML IJ SOLN
1.0000 mg | Freq: Once | INTRAMUSCULAR | Status: DC
  Filled 2016-06-13: qty 1

## 2016-06-13 MED ORDER — CITALOPRAM HYDROBROMIDE 20 MG PO TABS
10.0000 mg | ORAL_TABLET | Freq: Every day | ORAL | Status: DC
  Administered 2016-06-14 – 2016-06-15 (×2): 10 mg via ORAL
  Filled 2016-06-13 (×2): qty 1

## 2016-06-13 MED ORDER — METOPROLOL TARTRATE 50 MG PO TABS
100.0000 mg | ORAL_TABLET | Freq: Two times a day (BID) | ORAL | Status: DC
  Administered 2016-06-14 – 2016-06-15 (×2): 100 mg via ORAL
  Filled 2016-06-13 (×3): qty 2

## 2016-06-13 MED ORDER — SODIUM CHLORIDE 0.9 % IV BOLUS (SEPSIS)
1000.0000 mL | Freq: Once | INTRAVENOUS | Status: AC
  Administered 2016-06-14: 1000 mL via INTRAVENOUS

## 2016-06-13 MED ORDER — LORAZEPAM 2 MG/ML IJ SOLN
1.0000 mg | Freq: Once | INTRAMUSCULAR | Status: AC
  Administered 2016-06-13: 1 mg via INTRAMUSCULAR
  Filled 2016-06-13: qty 1

## 2016-06-13 MED ORDER — CALCIUM CARBONATE 600 MG PO TABS: 600.0000 mg | ORAL_TABLET | Freq: Every day | ORAL | Status: DC

## 2016-06-13 MED ORDER — VITAMIN E 180 MG (400 UNIT) PO CAPS
400.0000 [IU] | ORAL_CAPSULE | Freq: Every day | ORAL | Status: DC
  Administered 2016-06-14 – 2016-06-15 (×2): 400 [IU] via ORAL
  Filled 2016-06-13 (×2): qty 1

## 2016-06-13 MED ORDER — ONDANSETRON HCL 4 MG/2ML IJ SOLN: 4.0000 mg | Freq: Four times a day (QID) | INTRAMUSCULAR | Status: DC | PRN

## 2016-06-13 NOTE — Progress Notes (Signed)
PHARMACIST - PHYSICIAN ORDER COMMUNICATION  CONCERNING: P&T Medication Policy on Herbal Medications  DESCRIPTION:  This patient's order for:  Melatonin  has been noted.  This product(s) is classified as an "herbal" or natural product. Due to a lack of definitive safety studies or FDA approval, nonstandard manufacturing practices, plus the potential risk of unknown drug-drug interactions while on inpatient medications, the Pharmacy and Therapeutics Committee does not permit the use of "herbal" or natural products of this type within [facility].   ACTION TAKEN: The pharmacy department is unable to verify this order at this time and your patient has been informed of this safety policy. Please reevaluate patient's clinical condition at discharge and address if the herbal or natural product(s) should be resumed at that time.  Peggyann Juba, PharmD, [place] [phone] 06/13/2016 5:58 PM

## 2016-06-13 NOTE — ED Triage Notes (Signed)
Per family, pt is here from memory care.  Pt has had 2 episodes of "bleeding" today.  Family cannot confirm if blood in depend is vaginal or rectal.  No hx of same.  Pt has severe alzhiemers and unable to verbalize.  Pt is alert.  Per family, she has had steady decline over past weeks.  Son at bedside.

## 2016-06-13 NOTE — ED Notes (Signed)
To address with MD:  Per power of attorney, son Charlotte Crumb, no intubation or CPR.
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
## 2016-06-13 NOTE — ED Notes (Signed)
Unable to obtain IV due to contractures. IV team consult placed. 5E RN made aware.
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
## 2016-06-14 DIAGNOSIS — K625 Hemorrhage of anus and rectum: Secondary | ICD-10-CM | POA: Diagnosis not present

## 2016-06-14 DIAGNOSIS — I10 Essential (primary) hypertension: Secondary | ICD-10-CM

## 2016-06-14 DIAGNOSIS — F0390 Unspecified dementia without behavioral disturbance: Secondary | ICD-10-CM | POA: Diagnosis not present

## 2016-06-14 DIAGNOSIS — K922 Gastrointestinal hemorrhage, unspecified: Secondary | ICD-10-CM | POA: Diagnosis present

## 2016-06-14 LAB — HEMOGLOBIN AND HEMATOCRIT, BLOOD
HEMATOCRIT: 38.1 % (ref 36.0–46.0)
HEMATOCRIT: 38.2 % (ref 36.0–46.0)
HEMATOCRIT: 40.4 % (ref 36.0–46.0)
HEMOGLOBIN: 12.9 g/dL (ref 12.0–15.0)
HEMOGLOBIN: 13.3 g/dL (ref 12.0–15.0)
Hemoglobin: 12.5 g/dL (ref 12.0–15.0)

## 2016-06-14 LAB — COMPREHENSIVE METABOLIC PANEL
ALT: 17 U/L (ref 14–54)
ANION GAP: 6 (ref 5–15)
AST: 22 U/L (ref 15–41)
Albumin: 3.7 g/dL (ref 3.5–5.0)
Alkaline Phosphatase: 57 U/L (ref 38–126)
BUN: 15 mg/dL (ref 6–20)
CHLORIDE: 108 mmol/L (ref 101–111)
CO2: 27 mmol/L (ref 22–32)
Calcium: 8.9 mg/dL (ref 8.9–10.3)
Creatinine, Ser: 0.85 mg/dL (ref 0.44–1.00)
GFR calc Af Amer: >60 mL/min (ref >60)
Glucose, Bld: 129 mg/dL — ABNORMAL HIGH (ref 65–99)
POTASSIUM: 3.6 mmol/L (ref 3.5–5.1)
Sodium: 141 mmol/L (ref 135–145)
TOTAL PROTEIN: 7 g/dL (ref 6.5–8.1)
Total Bilirubin: 0.9 mg/dL (ref 0.3–1.2)

## 2016-06-14 LAB — MRSA PCR SCREENING: MRSA BY PCR: POSITIVE — AB

## 2016-06-14 MED ORDER — CHLORHEXIDINE GLUCONATE CLOTH 2 % EX PADS
6.0000 | MEDICATED_PAD | Freq: Every day | CUTANEOUS | Status: DC
  Administered 2016-06-15: 6 via TOPICAL

## 2016-06-14 MED ORDER — PANTOPRAZOLE SODIUM 40 MG PO TBEC: 40.0000 mg | DELAYED_RELEASE_TABLET | Freq: Every day | ORAL | Status: DC

## 2016-06-14 MED ORDER — PANTOPRAZOLE SODIUM 40 MG PO PACK
40.0000 mg | PACK | Freq: Every day | ORAL | Status: DC
  Administered 2016-06-14 – 2016-06-15 (×2): 40 mg via ORAL
  Filled 2016-06-14 (×3): qty 20

## 2016-06-14 MED ORDER — MUPIROCIN 2 % EX OINT
1.0000 "application " | TOPICAL_OINTMENT | Freq: Two times a day (BID) | CUTANEOUS | Status: DC
  Administered 2016-06-14 – 2016-06-15 (×3): 1 via NASAL
  Filled 2016-06-14: qty 22

## 2016-06-14 NOTE — Evaluation (Addendum)
Physical Therapy Evaluation Patient Details Name: Cheryl Armstrong MRN: [id number] DOB: July 25, 1928 Today's Date: 06/14/2016   History of Present Illness  Cheryl Armstrong is a 82 y.o. female with medical history significant of severe dementia, who presents today due to significant of bright red blood per rectum. The history is taken from her family at her bedside, patient is unable to give any history due to her advanced dementia. Apparently patient over last week has decreased her functional physical and mental capacity, more confusion, decreased by mouth intake, Admittedd 06/13/16 from ALF with bright red blood per rectum .  Clinical Impression  The patient was pleasant and able to participate in bed mobility and transfer to [facility] with multimodal cues of 1 assist. Anticipate that she will be able to ambulate with 1 assist but will need some type of brief for incontinence. Sons present to to provide history. MSW contacted that sons  request to speak to her.  Pt admitted with above diagnosis. Pt currently with functional limitations due to the deficits listed below (see PT Problem List).  Pt will benefit from skilled PT to increase their independence and safety with mobility to allow discharge to the venue listed below.       Follow Up Recommendations Home health PT;Supervision/Assistance - 24 hour    Equipment Recommendations  None recommended by PT    Recommendations for Other Services       Precautions / Restrictions Precautions Precautions: Fall Precaution Comments: incontinent B/B, frequent falls      Mobility  Bed Mobility Overal bed mobility: Needs Assistance Bed Mobility: Rolling Rolling: Supervision   Supine to sit: Min assist Sit to supine: Min assist   General bed mobility comments: multimodal cues for initiating rolling and  sitting up at bed edge. Patient was able to participate with gesturing and tactile cues. The bed was soaked with urine . Assisted to change bed  and wash up.  Transfers Overall transfer level: Needs assistance Equipment used: None Transfers: Sit to/from [facility] Sit to Stand: Min assist Stand pivot transfers: Min assist       General transfer comment: patient stands  with cues/gestures, multimodal cues, tactile cues to reach for [facility] and turn and sit down. Cues to stand and pivot to bed. Patient did urinate in [facility].   Ambulation/Gait                Stairs            Wheelchair Mobility    Modified Rankin (Stroke Patients Only)       Balance Overall balance assessment: Needs assistance;History of Falls Sitting-balance support: Feet supported;No upper extremity supported Sitting balance-Leahy Scale: Fair     Standing balance support: During functional activity;No upper extremity supported Standing balance-Leahy Scale: Fair Standing balance comment: stands for pericare with min assist                             Pertinent Vitals/Pain Pain Assessment: Faces Faces Pain Scale: No hurt    Home Living Family/patient expects to be discharged to:: Assisted living                 Additional Comments: from ALF,     Prior Function Level of Independence: Needs assistance   Gait / Transfers Assistance Needed: recently required assistance per sons, ambulated  without assist up until 2 weeks ago  Hand Dominance        Extremity/Trunk Assessment   Upper Extremity Assessment: Generalized weakness           Lower Extremity Assessment: Generalized weakness         Communication      Cognition Arousal/Alertness: Awake/alert Behavior During Therapy: WFL for tasks assessed/performed Overall Cognitive Status: History of cognitive impairments - at baseline                      General Comments      Exercises        Assessment/Plan    PT Assessment Patient needs continued PT services  PT Diagnosis Difficulty walking;Generalized  weakness;Altered mental status   PT Problem List Decreased strength;Decreased activity tolerance;Decreased mobility;Decreased cognition;Decreased knowledge of precautions  PT Treatment Interventions Gait training;Functional mobility training;Therapeutic activities;Therapeutic exercise;Patient/family education   PT Goals (Current goals can be found in the Care Plan section) Acute Rehab PT Goals Patient Stated Goal: per sons, to get stronger PT Goal Formulation: With family Time For Goal Achievement: 06/28/16 Potential to Achieve Goals: Fair    Frequency Min 2X/week   Barriers to discharge        Co-evaluation               End of Session   Activity Tolerance: Patient tolerated treatment well Patient left: in bed;with call bell/phone within reach;with bed alarm set;with family/visitor present Nurse Communication: Mobility status    Functional Assessment Tool Used: clinical judgement Functional Limitation: Mobility: Walking and moving around Mobility: Walking and Moving Around Current Status [id number]): At least 20 percent but less than 40 percent impaired, limited or restricted Mobility: Walking and Moving Around Goal Status [phone]): At least 1 percent but less than 20 percent impaired, limited or restricted    Time: 1449-1517 PT Time Calculation (min) (ACUTE ONLY): 28 min   Charges:   PT Evaluation $PT Eval Low Complexity: 1 Procedure PT Treatments $Therapeutic Activity: 8-22 mins   PT G Codes:   PT G-Codes **NOT FOR INPATIENT CLASS** Functional Assessment Tool Used: clinical judgement Functional Limitation: Mobility: Walking and moving around Mobility: Walking and Moving Around Current Status [id number]): At least 20 percent but less than 40 percent impaired, limited or restricted Mobility: Walking and Moving Around Goal Status [phone]): At least 1 percent but less than 20 percent impaired, limited or restricted    Claretha Cooper 06/14/2016, 3:51 PM Tresa Endo  PT [phone]  Note inadvertently addended by Social Worker--signed this date by Kenyon Ana, PT-- for Tresa Endo, PT

## 2016-06-14 NOTE — Progress Notes (Addendum)
Patient ID: Cheryl Armstrong, female   DOB: 1928-06-17, 82 y.o.   MRN: [id number]  PROGRESS NOTE    Cheryl Armstrong  [id number] DOB: 05/12/1928 DOA: 06/13/2016  PCP: Kathlene November, MD   Brief Narrative:  82 y.o. female with past medical history significant for severe dementia who presented to [facility] ED with bright red blood per rectum. Her physical and mental capacity declined over past few weeks or so per family report on admission. She was more confused and had poor po intake.  Assessment & Plan:   Active Problems:   Rectal bleed / Lower GI bleed - Hemoglobin remains WNL 13.3, 12.5 - Continue to monitor daily CBC - Start daily protonix    Hypokalemia - Supplemented and WNL    Essential hypertension - Continue metoprolol    Dementia without behavioral disturbance - Stable - Continue namenda, aricept and celexa    DVT prophylaxis: SCD's due to risk of bleeding  Code Status: DNR/DNI  Family Communication: no family at the bedside this am, called pt son [phone] and left my cell number for him to call me back for any questions or concerns  Disposition Plan: home in next 24-48 hours if hgb stable    Consultants:   None   Procedures:   None   Antimicrobials:   None    Subjective: No overnight events.   Objective: Vitals:   06/13/16 1730 06/13/16 2127 06/13/16 2150 06/14/16 0551  BP: (!) 155/49 (!) 133/92 (!) 128/38 (!) 141/61  Pulse: (!) 56 100  70  Resp:  16  18  Temp: 98.6 F (37 C) 98.6 F (37 C)  97.3 F (36.3 C)  TempSrc: Axillary Axillary  Oral  SpO2: 97% 98%  94%  Weight: 51.9 kg (114 lb 8 oz)     Height:        Intake/Output Summary (Last 24 hours) at 06/14/16 1439 Last data filed at 06/14/16 0300  Gross per 24 hour  Intake            81.25 ml  Output                0 ml  Net            81.25 ml   Filed Weights   06/13/16 1240 06/13/16 1730  Weight: 48.1 kg (106 lb) 51.9 kg (114 lb 8 oz)    Examination:  General exam: Appears  calm and comfortable  Respiratory system: Clear to auscultation. Respiratory effort normal. Cardiovascular system: S1 & S2 heard, Rate controlled  Gastrointestinal system: Abdomen is nondistended, soft and nontender. No organomegaly or masses felt. Normal bowel sounds heard. Central nervous system: No focal neurological deficits. Extremities: Symmetric 5 x 5 power. Skin: No rashes, lesions or ulcers Psychiatry: Mood & affect appropriate.   Data Reviewed: I have personally reviewed following labs and imaging studies  CBC:  Recent Labs Lab 06/13/16 1252 06/13/16 1821 06/13/16 2355 06/14/16 0554 06/14/16 1203  WBC 14.3*  --   --   --   --   HGB 13.9 14.3 12.9 13.3 12.5  HCT 42.7 42.8 38.2 40.4 38.1  MCV 90.7  --   --   --   --   PLT 255  --   --   --   --    Basic Metabolic Panel:  Recent Labs Lab 06/13/16 1252 06/14/16 0554  NA 137 141  K 3.3* 3.6  CL 104 108  CO2 24 27  GLUCOSE 168* 129*  BUN 20 15  CREATININE 1.17* 0.85  CALCIUM 9.6 8.9   GFR: Estimated Creatinine Clearance: 37.6 mL/min (by C-G formula based on SCr of 0.85 mg/dL). Liver Function Tests:  Recent Labs Lab 06/13/16 1252 06/14/16 0554  AST 26 22  ALT 20 17  ALKPHOS 62 57  BILITOT 0.4 0.9  PROT 7.5 7.0  ALBUMIN 4.1 3.7   No results for input(s): LIPASE, AMYLASE in the last 168 hours. No results for input(s): AMMONIA in the last 168 hours. Coagulation Profile: No results for input(s): INR, PROTIME in the last 168 hours. Cardiac Enzymes: No results for input(s): CKTOTAL, CKMB, CKMBINDEX, TROPONINI in the last 168 hours. BNP (last 3 results) No results for input(s): PROBNP in the last 8760 hours. HbA1C: No results for input(s): HGBA1C in the last 72 hours. CBG: No results for input(s): GLUCAP in the last 168 hours. Lipid Profile: No results for input(s): CHOL, HDL, LDLCALC, TRIG, CHOLHDL, LDLDIRECT in the last 72 hours. Thyroid Function Tests: No results for input(s): TSH, T4TOTAL,  FREET4, T3FREE, THYROIDAB in the last 72 hours. Anemia Panel: No results for input(s): VITAMINB12, FOLATE, FERRITIN, TIBC, IRON, RETICCTPCT in the last 72 hours. Urine analysis:    Component Value Date/Time   COLORURINE YELLOW 06/13/2016 1540   APPEARANCEUR CLEAR 06/13/2016 1540   LABSPEC 1.005 06/13/2016 1540   PHURINE 5.5 06/13/2016 1540   GLUCOSEU NEGATIVE 06/13/2016 1540   GLUCOSEU NEGATIVE 02/22/2016 1415   HGBUR NEGATIVE 06/13/2016 1540   BILIRUBINUR NEGATIVE 06/13/2016 1540   BILIRUBINUR Negative 02/22/2016 1145   KETONESUR NEGATIVE 06/13/2016 1540   PROTEINUR NEGATIVE 06/13/2016 1540   UROBILINOGEN 0.2 02/22/2016 1415   NITRITE NEGATIVE 06/13/2016 1540   LEUKOCYTESUR TRACE (A) 06/13/2016 1540   Sepsis Labs: @LABRCNTIP (procalcitonin:4,lacticidven:4)    Recent Results (from the past 240 hour(s))  MRSA PCR Screening     Status: Abnormal   Collection Time: 06/13/16 11:21 PM  Result Value Ref Range Status   MRSA by PCR POSITIVE (A) NEGATIVE Final      Radiology Studies: No results found.   Scheduled Meds: . calcium carbonate  1 tablet Oral Q breakfast  . [START ON 06/15/2016] Chlorhexidine Gluconate Cloth  6 each Topical Q0600  . citalopram  10 mg Oral Daily  . donepezil  10 mg Oral QHS  . memantine  28 mg Oral Daily  . metoprolol  100 mg Oral BID  . mupirocin ointment  1 application Nasal BID  . vitamin E  400 Units Oral Q breakfast   Continuous Infusions: . dextrose 5 % and 0.9% NaCl 75 mL/hr at 06/14/16 1433     LOS: 1 day    Time spent: 15 minutes  Greater than 50% of the time spent on counseling and coordinating the care.   Leisa Lenz, MD [facility] Pager [phone]  If 7PM-7AM, please contact night-coverage [email or web address] Password [facility] 06/14/2016, 2:39 PM

## 2016-06-14 NOTE — Progress Notes (Signed)
CRITICAL VALUE ALERT  Critical value received:  MRSA nasal swab positive  Date of notification: 06/14/16  Time of notification: 11:55am  Critical value read back: yes  Nurse who received alert:  Henrietta Dine, RN  MD notified (1st page):  Dr. Charlies Silvers  Time of first page:  12:09  MD notified (2nd page):  Time of second page:  Responding MD:  Dr. Charlies Silvers  Time MD responded:  12:10

## 2016-06-14 NOTE — Clinical Social Work Note (Signed)
Clinical Social Work Assessment  Patient Details  Name: Cheryl Armstrong MRN: [id number] Date of Birth: 04-Mar-1928  Date of referral:  06/14/16               Reason for consult:  Facility Placement                Permission sought to share information with:  Facility Sport and exercise psychologist, Case Manager Permission granted to share information::     Name::        Agency::     Relationship::  Adult Children  Contact Information:     [place], [place]on [phone]    [place], [place] [phone]       Housing/Transportation Living arrangements for the past 2 months:  [place] of Information:  Adult Children Patient Interpreter Needed:  None Criminal Activity/Legal Involvement Pertinent to Current Situation/Hospitalization:  No - Comment as needed Significant Relationships:  None, Adult Children Lives with:  Facility Resident Do you feel safe going back to the place where you live?  Yes Need for family participation in patient care:  Yes  Care giving concerns:  Patient family is worried about the patient nutrition intake over at [facility]. Patient has been living there for awhile apart of the memory care unit and they are worried about her.    Social Worker assessment / plan:  LCSWA met with patient and family at bedside. Patient sleeping soundly. Patient family expressed concerns about patient nutrition intake at ALF and inquired if they should seek high level of care. LCSWA expressed the family should speak with the ALF about the patient nutrition intake because the patient is ambulatory at this time and medicare will not cover the cost of SNF. The family does has the option to pay out of pocket for SNF or get a agency to come into ALF to make sure the patient is eating during meal time. Patient family receptive to suggestions.  Plan: Provide patient family with outside agencies and assist with patient return to ALF.   Employment status:   Retired Forensic scientist:  Medicare PT Recommendations:  Home with [place] / Referral to community resources:     Patient/Family's Response to care: Agreeable  Patient/Family's Understanding of and Emotional Response to Diagnosis, Current Treatment, and Prognosis:  "Patient family is very involved in patient care. They expressed they are worried about patient nutrition and want the best level of care for the patient at this time.   Emotional Assessment Appearance:  Appears stated age Attitude/Demeanor/Rapport:    Affect (typically observed):    Orientation:  Oriented to Self Alcohol / Substance use:  Not Applicable Psych involvement (Current and /or in the community):  No (Comment)  Discharge Needs  Concerns to be addressed:  Care Coordination, Discharge Planning Concerns Readmission within the last 30 days:  Yes Current discharge risk:  None Barriers to Discharge:  No Barriers Identified   Lia Hopping, LCSW 06/14/2016, 4:15 PM

## 2016-06-15 DIAGNOSIS — F0390 Unspecified dementia without behavioral disturbance: Secondary | ICD-10-CM | POA: Diagnosis not present

## 2016-06-15 DIAGNOSIS — K922 Gastrointestinal hemorrhage, unspecified: Secondary | ICD-10-CM | POA: Diagnosis not present

## 2016-06-15 DIAGNOSIS — K625 Hemorrhage of anus and rectum: Secondary | ICD-10-CM | POA: Diagnosis not present

## 2016-06-15 DIAGNOSIS — I10 Essential (primary) hypertension: Secondary | ICD-10-CM | POA: Diagnosis not present

## 2016-06-15 LAB — CBC
HCT: 37.4 % (ref 36.0–46.0)
Hemoglobin: 12.3 g/dL (ref 12.0–15.0)
MCH: 30.1 pg (ref 26.0–34.0)
MCHC: 32.9 g/dL (ref 30.0–36.0)
MCV: 91.7 fL (ref 78.0–100.0)
PLATELETS: 196 10*3/uL (ref 150–400)
RBC: 4.08 MIL/uL (ref 3.87–5.11)
RDW: 12.7 % (ref 11.5–15.5)
WBC: 7.5 10*3/uL (ref 4.0–10.5)

## 2016-06-15 MED ORDER — PANTOPRAZOLE SODIUM 40 MG PO PACK: 40.0000 mg | PACK | Freq: Every day | ORAL | 0 refills | Status: AC

## 2016-06-15 NOTE — Progress Notes (Addendum)
Discussed patient disposition and care at current  ALF.  Provided list of Assisted Living Facilities in [place] area. Family thankful for list.  FL2/ Therapy Notes/Discharge Summary Faxed to ALF.  Family will transport patient.    Facility is currently having difficulties receiving faxed clinicals.  LCSWA refaxed clinicals. 1:09pm LCSWA provided ALF coordinator-Eugene with [facility] IT information.LCSWA gave instructions to provide IT with updated fax number for [facility]. Therefore patient clinicals will be faxed over a secure network.  3:25pmLCSWA Submitted information by faxing through Epic. Facility received.

## 2016-06-15 NOTE — Discharge Summary (Signed)
Physician Discharge Summary  Cheryl Armstrong [id number] DOB: 14-Feb-1928 DOA: 06/13/2016  PCP: Kathlene November, MD  Admit date: 06/13/2016 Discharge date: 06/15/2016  Recommendations for Outpatient Follow-up:  Avoid aspirin or NSAIDS for at least 2 weeks on discharge. Please recheck hemoglobin prior to resumption of aspirin.  Discharge Diagnoses:  Active Problems:   Rectal bleed   GI bleeding   Lower GI bleed    Discharge Condition: stable   Diet recommendation: as tolerated   History of present illness:  82 y.o.femalewith past medical history significant for severe dementia who presented to [facility] ED with bright red blood per rectum. Her physical and mental capacity declined over past few weeks or so per family report on admission. She was more confused and had poor po intake.  Hospital Course:    Assessment & Plan:   Active Problems:   Rectal bleed / Lower GI bleed - Hemoglobin remains WNL 13.3, 12.5 - Continue Protonix daily    Hypokalemia - Supplemented and WNL    Essential hypertension - Continue metoprololAnd Norvasc    Dementia without behavioral disturbance - Stable - Continue namenda, aricept and celexa    DVT prophylaxis: SCD's due to risk of bleeding  Code Status: DNR/DNI  Family Communication: Spoke with family at the bedside, patient's son and patient's daughter-in-law. We talked about plan of care prior to discharge which would include swallowing evaluation. We also talked about avoiding aspirin for at least 2 weeks after discharge and recheck a hemoglobin prior to resumption of aspirin    Consultants:   SLP  Procedures:   None   Antimicrobials:   None    Signed:  Leisa Lenz, MD  [facility] 06/15/2016, 10:56 AM  Pager #: [phone]  Time spent in minutes: more than 30 minutes  Discharge Exam: Vitals:   06/14/16 2111 06/15/16 0551  BP: (!) 141/41 (!) 162/58  Pulse: (!) 59 64  Resp:    Temp: 98.6 F (37 C)  98.4 F (36.9 C)   Vitals:   06/14/16 1400 06/14/16 1530 06/14/16 2111 06/15/16 0551  BP: (!) 177/51 (!) 159/56 (!) 141/41 (!) 162/58  Pulse: (!) 55 60 (!) 59 64  Resp: 18     Temp: 98.5 F (36.9 C)  98.6 F (37 C) 98.4 F (36.9 C)  TempSrc: Axillary  Oral Oral  SpO2: 100%  97% 99%  Weight:      Height:        General: Pt is alert, not in acute distress Cardiovascular: Regular rate and rhythm, S1/S2 + Respiratory: Clear to auscultation bilaterally, no wheezing, no crackles, no rhonchi Abdominal: Soft, non tender, non distended, bowel sounds +, no guarding Extremities: no edema, no cyanosis, pulses palpable bilaterally DP and PT Neuro: Grossly nonfocal  Discharge Instructions  Discharge Instructions    Call MD for:  difficulty breathing, headache or visual disturbances    Complete by:  As directed   Call MD for:  hives    Complete by:  As directed   Call MD for:  persistant nausea and vomiting    Complete by:  As directed   Call MD for:  severe uncontrolled pain    Complete by:  As directed   Diet - low sodium heart healthy    Complete by:  As directed   Discharge instructions    Complete by:  As directed   Avoid aspirin or NSAIDS for at least 2 weeks on discharge. Please recheck hemoglobin prior to resumption of aspirin.  Increase activity slowly    Complete by:  As directed       Medication List    STOP taking these medications   ALPRAZolam 0.5 MG tablet Commonly known as:  XANAX   aspirin 81 MG chewable tablet     TAKE these medications   acetaminophen 500 MG tablet Commonly known as:  TYLENOL Take 500 mg by mouth 3 (three) times daily as needed (for pain).   alendronate 70 MG tablet Commonly known as:  FOSAMAX Take 1 tablet (70 mg total) by mouth once a week. Take with a full glass of water on an empty stomach. What changed:  when to take this  additional instructions   amLODipine 10 MG tablet Commonly known as:  NORVASC Take 1 tablet (10 mg total)  by mouth daily.   calcium carbonate 600 MG Tabs tablet Commonly known as:  OS-CAL Take 600 mg by mouth daily.   citalopram 10 MG tablet Commonly known as:  CELEXA Take 1 tablet (10 mg total) by mouth daily.   donepezil 10 MG tablet Commonly known as:  ARICEPT Take 1 tablet (10 mg total) by mouth at bedtime.   LIQUID TEARS OP Place 1 drop into both eyes every evening.   Melatonin 3 MG Tabs Take 3 mg by mouth at bedtime.   memantine 28 MG Cp24 24 hr capsule Commonly known as:  NAMENDA XR Take 1 capsule (28 mg total) by mouth daily.   metoprolol 50 MG tablet Commonly known as:  LOPRESSOR Take 2 tablets (100 mg total) by mouth 2 (two) times daily.   pantoprazole sodium 40 mg/20 mL Pack Commonly known as:  PROTONIX Take 20 mLs (40 mg total) by mouth daily.   vitamin E 400 UNIT capsule Take 400 Units by mouth daily with breakfast. Reported on 12/06/2015      Follow-up [place], MD. Schedule an appointment as soon as possible for a visit in 2 week(s).   Specialty:  Internal Medicine Contact information: [address] [place] [address] [phone]            The results of significant diagnostics from this hospitalization (including imaging, microbiology, ancillary and laboratory) are listed below for reference.    Significant Diagnostic Studies: No results found.  Microbiology: Recent Results (from the past 240 hour(s))  MRSA PCR Screening     Status: Abnormal   Collection Time: 06/13/16 11:21 PM  Result Value Ref Range Status   MRSA by PCR POSITIVE (A) NEGATIVE Final     Labs: Basic Metabolic Panel:  Recent Labs Lab 06/13/16 1252 06/14/16 0554  NA 137 141  K 3.3* 3.6  CL 104 108  CO2 24 27  GLUCOSE 168* 129*  BUN 20 15  CREATININE 1.17* 0.85  CALCIUM 9.6 8.9   Liver Function Tests:  Recent Labs Lab 06/13/16 1252 06/14/16 0554  AST 26 22  ALT 20 17  ALKPHOS 62 57  BILITOT 0.4 0.9  PROT 7.5 7.0   ALBUMIN 4.1 3.7   No results for input(s): LIPASE, AMYLASE in the last 168 hours. No results for input(s): AMMONIA in the last 168 hours. CBC:  Recent Labs Lab 06/13/16 1252 06/13/16 1821 06/13/16 2355 06/14/16 0554 06/14/16 1203 06/15/16 0511  WBC 14.3*  --   --   --   --  7.5  HGB 13.9 14.3 12.9 13.3 12.5 12.3  HCT 42.7 42.8 38.2 40.4 38.1 37.4  MCV 90.7  --   --   --   --  91.7  PLT 255  --   --   --   --  196   Cardiac Enzymes: No results for input(s): CKTOTAL, CKMB, CKMBINDEX, TROPONINI in the last 168 hours. BNP: BNP (last 3 results) No results for input(s): BNP in the last 8760 hours.  ProBNP (last 3 results) No results for input(s): PROBNP in the last 8760 hours.  CBG: No results for input(s): GLUCAP in the last 168 hours.

## 2016-06-15 NOTE — Progress Notes (Signed)
Called report to Sondra Barges, RN at [facility].  Patient was transported back to facility by son.

## 2016-06-15 NOTE — Progress Notes (Signed)
Patient transported by family.  RN given report.

## 2016-06-15 NOTE — NC FL2 (Signed)
[place] LEVEL OF CARE SCREENING TOOL     IDENTIFICATION  Patient Name: Cheryl Armstrong Birthdate: 1928-05-16 Sex: female Admission Date (Current Location): 06/13/2016  [facility] and [place] Number:  Herbalist and Address:  [facility],  [place] [address], [place]      Provider Number: [id number]  Attending Physician Name and Address:  Robbie Lis, MD  Relative Name and Phone Number:       Current Level of Care: Hospital Recommended Level of Care: [place], Memory Care Prior Approval Number:    Date Approved/Denied:   PASRR Number:    Discharge Plan: Other (Comment) ([place])    Current Diagnoses: Patient Active Problem List   Diagnosis Date Noted  . Lower GI bleed 06/14/2016  . Rectal bleed 06/13/2016  . GI bleeding 06/13/2016  . Follow-up ---------------PCP NOTES 06/23/2015  . Alzheimer's dementia 02/25/2015  . Cerebrovascular disease 02/25/2015  . Skin lesion 07/26/2014  . Dementia 08/18/2012  . Medicare annual wellness visit, subsequent 12/24/2011  . Fatigue 05/23/2011  . Prediabetes 01/30/2011  . OSTEOPENIA 07/21/2007  . Anxiety state 10/22/2006  . DETACHMENT, RETINAL NOS 10/22/2006  . Essential hypertension 10/22/2006    Orientation RESPIRATION BLADDER Height & Weight     Self  Normal Incontinent Weight: 114 lb 8 oz (51.9 kg) Height:  5\' 2"  (157.5 cm)  BEHAVIORAL SYMPTOMS/MOOD NEUROLOGICAL BOWEL NUTRITION STATUS      Incontinent Diet  AMBULATORY STATUS COMMUNICATION OF NEEDS Skin   Limited Assist Verbally Normal                       Personal Care Assistance Level of Assistance  Bathing, Feeding, Dressing Bathing Assistance: Limited assistance Feeding assistance: Limited assistance Dressing Assistance: Limited assistance     Functional Limitations Info  Sight, Hearing, Speech Sight Info: Impaired Hearing Info: Adequate Speech Info: Adequate     SPECIAL CARE FACTORS FREQUENCY  PT (By licensed PT)                    Contractures Contractures Info: Not present    Additional Factors Info  Code Status, Allergies, Psychotropic Code Status Info: DNR Allergies Info: Sulfonamide Derivatives           Current Medications (06/15/2016):  This is the current hospital active medication list Current Facility-Administered Medications  Medication Dose Route Frequency Provider Last Rate Last Dose  . acetaminophen (TYLENOL) tablet 650 mg  650 mg Oral Q6H PRN Tawni Millers, MD   650 mg at 06/13/16 2145   Or  . acetaminophen (TYLENOL) suppository 650 mg  650 mg Rectal Q6H PRN Tawni Millers, MD      . ALPRAZolam Duanne Moron) tablet 0.5 mg  0.5 mg Oral TID PRN Tawni Millers, MD   0.5 mg at 06/14/16 2108  . calcium carbonate (OS-CAL - dosed in mg of elemental calcium) tablet 500 mg of elemental calcium  1 tablet Oral Q breakfast Tawni Millers, MD   500 mg of elemental calcium at 06/14/16 1120  . Chlorhexidine Gluconate Cloth 2 % PADS 6 each  6 each Topical Q0600 Robbie Lis, MD   6 each at 06/15/16 [phone]  . citalopram (CELEXA) tablet 10 mg  10 mg Oral Daily Mauricio Gerome Apley, MD   10 mg at 06/14/16 1120  . dextrose 5 %-0.9 % sodium chloride infusion   Intravenous Continuous Mauricio Gerome Apley, MD  75 mL/hr at 06/15/16 0504    . donepezil (ARICEPT) tablet 10 mg  10 mg Oral QHS Tawni Millers, MD   10 mg at 06/14/16 2109  . memantine (NAMENDA XR) 24 hr capsule 28 mg  28 mg Oral Daily Tawni Millers, MD   28 mg at 06/14/16 1121  . metoprolol (LOPRESSOR) tablet 100 mg  100 mg Oral BID Tawni Millers, MD   100 mg at 06/14/16 1121  . mupirocin ointment (BACTROBAN) 2 % 1 application  1 application Nasal BID Robbie Lis, MD   1 application at [id number]  . ondansetron (ZOFRAN) tablet 4 mg  4 mg Oral Q6H PRN Tawni Millers, MD       Or  . ondansetron [facility]) injection 4  mg  4 mg Intravenous Q6H PRN Tawni Millers, MD      . pantoprazole sodium (PROTONIX) 40 mg/20 mL oral suspension 40 mg  40 mg Oral Daily Robbie Lis, MD   40 mg at 06/14/16 1951  . vitamin E capsule 400 Units  400 Units Oral Q breakfast Tawni Millers, MD   400 Units at 06/14/16 1121     Discharge Medications: Please see discharge summary for a list of discharge medications.  Relevant Imaging Results:  Relevant Lab Results:   Additional Information [id number]  Lia Hopping, LCSW

## 2016-06-15 NOTE — Clinical Social Work Placement (Addendum)
   CLINICAL SOCIAL WORK PLACEMENT  NOTE  Date:  06/15/2016  Patient Details  Name: Cheryl Armstrong MRN: [id number] Date of Birth: 01-10-1928  Clinical Social Work is seeking post-discharge placement for this patient at the [place] level of care (*CSW will initial, date and re-position this form in  chart as items are completed):      Patient/family provided with Early Work Department's list of facilities offering this level of care within the geographic area requested by the patient (or if unable, by the patient's family).      Patient/family informed of their freedom to choose among providers that offer the needed level of care, that participate in Medicare, Medicaid or managed care program needed by the patient, have an available bed and are willing to accept the patient.      Patient/family informed of [place]'s ownership interest in [facility] and [facility], as well as of the fact that they are under no obligation to receive care at these facilities.  PASRR submitted to EDS on       PASRR number received on       Existing PASRR number confirmed on       FL2 transmitted to all facilities in geographic area requested by pt/family on       FL2 transmitted to all facilities within larger geographic area on       Patient informed that his/her managed care company has contracts with or will negotiate with certain facilities, including the following:  [employer]         Patient/family informed of bed offers received.  Patient chooses bed at   [place] recommends and patient chooses bed at      Patient to be transferred to [facility] on 06/15/16.  Patient to be transferred to facility by Family     Patient family notified on 06/15/16 of transfer.  Name of family member notified:  Charmaine, [place] [phone]      PHYSICIAN       Additional Comment:  Patient will  return.  _______________________________________________ Lia Hopping, LCSW 06/15/2016, 3:56 PM

## 2016-06-15 NOTE — Progress Notes (Signed)
Date: June 15, 2016 Discharge orders checked for needs. No needs present at time of discharge. Velva Harman, RN, BSN, [place]   [phone]

## 2016-06-15 NOTE — Discharge Instructions (Signed)

## 2016-06-15 NOTE — Progress Notes (Signed)
Limited BSE completed.  Full report to follow.   Pt accepted only few boluses of icecream and soda from family.  No indication of airway compromise with intake and voice remained clear throughout.  Laryngeal elevation observed at bedside appeared functional.    Educated pt's two sons and daughter in law to findings/compensation strategies to mitigate dysphagia associated with dementia.  Recommend regular/thin.  Luanna Salk, [facility] SLP [phone]
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
## 2016-06-18 ENCOUNTER — Telehealth: Payer: Self-pay | Admitting: Internal Medicine

## 2016-06-18 NOTE — Telephone Encounter (Signed)
Was discharged from the hospital, please check on her, see if she needs a follow-up with me  (I'll be out of the office for few days, if needed she could see another provider)

## 2016-06-19 NOTE — Telephone Encounter (Signed)
Thank you :)

## 2016-06-19 NOTE — Telephone Encounter (Signed)
Called patient's daughter-in-law, Gay Filler (on [place]). States pt is doing okay and has not had any further problems with rectal bleeding. She is in SNF and does require increased assistance w/ ADLs, specifically feeding. Gay Filler states the pt's dementia is advancing to the point where she does not really recognize family members anymore. She has a provider at SNF that will begin following for medical needs so that she does not have to leave facility for appts. Pt's family will be in touch if there are any further needs from our office.

## 2016-06-20 DIAGNOSIS — F0390 Unspecified dementia without behavioral disturbance: Secondary | ICD-10-CM | POA: Diagnosis not present

## 2016-06-20 DIAGNOSIS — K922 Gastrointestinal hemorrhage, unspecified: Secondary | ICD-10-CM | POA: Diagnosis not present

## 2016-06-20 DIAGNOSIS — R296 Repeated falls: Secondary | ICD-10-CM | POA: Diagnosis not present

## 2016-06-20 DIAGNOSIS — I10 Essential (primary) hypertension: Secondary | ICD-10-CM | POA: Diagnosis not present

## 2016-06-20 DIAGNOSIS — M8580 Other specified disorders of bone density and structure, unspecified site: Secondary | ICD-10-CM | POA: Diagnosis not present

## 2016-06-20 DIAGNOSIS — K219 Gastro-esophageal reflux disease without esophagitis: Secondary | ICD-10-CM | POA: Diagnosis not present

## 2016-07-10 DIAGNOSIS — F0391 Unspecified dementia with behavioral disturbance: Secondary | ICD-10-CM | POA: Diagnosis not present

## 2016-07-10 DIAGNOSIS — F419 Anxiety disorder, unspecified: Secondary | ICD-10-CM | POA: Diagnosis not present

## 2016-07-12 DIAGNOSIS — F419 Anxiety disorder, unspecified: Secondary | ICD-10-CM | POA: Diagnosis not present

## 2016-07-12 DIAGNOSIS — I10 Essential (primary) hypertension: Secondary | ICD-10-CM | POA: Diagnosis not present

## 2016-07-12 DIAGNOSIS — K219 Gastro-esophageal reflux disease without esophagitis: Secondary | ICD-10-CM | POA: Diagnosis not present

## 2016-07-18 DIAGNOSIS — F0390 Unspecified dementia without behavioral disturbance: Secondary | ICD-10-CM | POA: Diagnosis not present

## 2016-07-18 DIAGNOSIS — K922 Gastrointestinal hemorrhage, unspecified: Secondary | ICD-10-CM | POA: Diagnosis not present

## 2016-07-18 DIAGNOSIS — K219 Gastro-esophageal reflux disease without esophagitis: Secondary | ICD-10-CM | POA: Diagnosis not present

## 2016-07-18 DIAGNOSIS — I10 Essential (primary) hypertension: Secondary | ICD-10-CM | POA: Diagnosis not present

## 2016-07-18 DIAGNOSIS — R296 Repeated falls: Secondary | ICD-10-CM | POA: Diagnosis not present

## 2016-07-18 DIAGNOSIS — M8580 Other specified disorders of bone density and structure, unspecified site: Secondary | ICD-10-CM | POA: Diagnosis not present

## 2016-07-31 ENCOUNTER — Other Ambulatory Visit: Payer: Self-pay | Admitting: Internal Medicine

## 2016-08-07 DIAGNOSIS — F0391 Unspecified dementia with behavioral disturbance: Secondary | ICD-10-CM | POA: Diagnosis not present

## 2016-08-07 DIAGNOSIS — F419 Anxiety disorder, unspecified: Secondary | ICD-10-CM | POA: Diagnosis not present

## 2016-08-14 DIAGNOSIS — I10 Essential (primary) hypertension: Secondary | ICD-10-CM | POA: Diagnosis not present

## 2016-08-14 DIAGNOSIS — F0391 Unspecified dementia with behavioral disturbance: Secondary | ICD-10-CM | POA: Diagnosis not present

## 2016-08-15 DIAGNOSIS — R296 Repeated falls: Secondary | ICD-10-CM | POA: Diagnosis not present

## 2016-08-15 DIAGNOSIS — M8580 Other specified disorders of bone density and structure, unspecified site: Secondary | ICD-10-CM | POA: Diagnosis not present

## 2016-08-15 DIAGNOSIS — F0390 Unspecified dementia without behavioral disturbance: Secondary | ICD-10-CM | POA: Diagnosis not present

## 2016-08-15 DIAGNOSIS — K922 Gastrointestinal hemorrhage, unspecified: Secondary | ICD-10-CM | POA: Diagnosis not present

## 2016-08-15 DIAGNOSIS — K219 Gastro-esophageal reflux disease without esophagitis: Secondary | ICD-10-CM | POA: Diagnosis not present

## 2016-08-15 DIAGNOSIS — I10 Essential (primary) hypertension: Secondary | ICD-10-CM | POA: Diagnosis not present

## 2016-09-04 DIAGNOSIS — F419 Anxiety disorder, unspecified: Secondary | ICD-10-CM | POA: Diagnosis not present

## 2016-09-04 DIAGNOSIS — F0391 Unspecified dementia with behavioral disturbance: Secondary | ICD-10-CM | POA: Diagnosis not present

## 2016-09-13 DIAGNOSIS — F0391 Unspecified dementia with behavioral disturbance: Secondary | ICD-10-CM | POA: Diagnosis not present

## 2016-10-03 DIAGNOSIS — F419 Anxiety disorder, unspecified: Secondary | ICD-10-CM | POA: Diagnosis not present

## 2016-10-03 DIAGNOSIS — F0391 Unspecified dementia with behavioral disturbance: Secondary | ICD-10-CM | POA: Diagnosis not present

## 2016-10-10 ENCOUNTER — Encounter: Payer: Self-pay | Admitting: *Deleted

## 2016-10-10 NOTE — Telephone Encounter (Signed)
This encounter was created in error - please disregard.

## 2016-10-13 DIAGNOSIS — F0391 Unspecified dementia with behavioral disturbance: Secondary | ICD-10-CM | POA: Diagnosis not present

## 2016-10-13 DIAGNOSIS — F419 Anxiety disorder, unspecified: Secondary | ICD-10-CM | POA: Diagnosis not present

## 2016-10-30 DIAGNOSIS — R2681 Unsteadiness on feet: Secondary | ICD-10-CM | POA: Diagnosis not present

## 2016-10-30 DIAGNOSIS — M6281 Muscle weakness (generalized): Secondary | ICD-10-CM | POA: Diagnosis not present

## 2016-10-30 DIAGNOSIS — Z9181 History of falling: Secondary | ICD-10-CM | POA: Diagnosis not present

## 2016-11-01 DIAGNOSIS — M6281 Muscle weakness (generalized): Secondary | ICD-10-CM | POA: Diagnosis not present

## 2016-11-01 DIAGNOSIS — Z9181 History of falling: Secondary | ICD-10-CM | POA: Diagnosis not present

## 2016-11-01 DIAGNOSIS — R2681 Unsteadiness on feet: Secondary | ICD-10-CM | POA: Diagnosis not present

## 2016-11-02 DIAGNOSIS — Z9181 History of falling: Secondary | ICD-10-CM | POA: Diagnosis not present

## 2016-11-02 DIAGNOSIS — R2681 Unsteadiness on feet: Secondary | ICD-10-CM | POA: Diagnosis not present

## 2016-11-02 DIAGNOSIS — M6281 Muscle weakness (generalized): Secondary | ICD-10-CM | POA: Diagnosis not present

## 2016-11-05 DIAGNOSIS — M6281 Muscle weakness (generalized): Secondary | ICD-10-CM | POA: Diagnosis not present

## 2016-11-05 DIAGNOSIS — R2681 Unsteadiness on feet: Secondary | ICD-10-CM | POA: Diagnosis not present

## 2016-11-05 DIAGNOSIS — Z9181 History of falling: Secondary | ICD-10-CM | POA: Diagnosis not present

## 2016-11-06 DIAGNOSIS — M6281 Muscle weakness (generalized): Secondary | ICD-10-CM | POA: Diagnosis not present

## 2016-11-06 DIAGNOSIS — R2681 Unsteadiness on feet: Secondary | ICD-10-CM | POA: Diagnosis not present

## 2016-11-06 DIAGNOSIS — Z9181 History of falling: Secondary | ICD-10-CM | POA: Diagnosis not present

## 2016-11-07 DIAGNOSIS — R2681 Unsteadiness on feet: Secondary | ICD-10-CM | POA: Diagnosis not present

## 2016-11-07 DIAGNOSIS — Z9181 History of falling: Secondary | ICD-10-CM | POA: Diagnosis not present

## 2016-11-07 DIAGNOSIS — M6281 Muscle weakness (generalized): Secondary | ICD-10-CM | POA: Diagnosis not present

## 2016-11-07 DIAGNOSIS — Z0000 Encounter for general adult medical examination without abnormal findings: Secondary | ICD-10-CM | POA: Diagnosis not present

## 2016-11-08 DIAGNOSIS — M6281 Muscle weakness (generalized): Secondary | ICD-10-CM | POA: Diagnosis not present

## 2016-11-08 DIAGNOSIS — F0390 Unspecified dementia without behavioral disturbance: Secondary | ICD-10-CM | POA: Diagnosis not present

## 2016-11-08 DIAGNOSIS — R2681 Unsteadiness on feet: Secondary | ICD-10-CM | POA: Diagnosis not present

## 2016-11-08 DIAGNOSIS — Z9181 History of falling: Secondary | ICD-10-CM | POA: Diagnosis not present

## 2016-11-08 DIAGNOSIS — K219 Gastro-esophageal reflux disease without esophagitis: Secondary | ICD-10-CM | POA: Diagnosis not present

## 2016-11-08 DIAGNOSIS — K922 Gastrointestinal hemorrhage, unspecified: Secondary | ICD-10-CM | POA: Diagnosis not present

## 2016-11-08 DIAGNOSIS — M8580 Other specified disorders of bone density and structure, unspecified site: Secondary | ICD-10-CM | POA: Diagnosis not present

## 2016-11-08 DIAGNOSIS — I10 Essential (primary) hypertension: Secondary | ICD-10-CM | POA: Diagnosis not present

## 2016-11-08 DIAGNOSIS — R296 Repeated falls: Secondary | ICD-10-CM | POA: Diagnosis not present

## 2016-11-09 DIAGNOSIS — R2681 Unsteadiness on feet: Secondary | ICD-10-CM | POA: Diagnosis not present

## 2016-11-09 DIAGNOSIS — M6281 Muscle weakness (generalized): Secondary | ICD-10-CM | POA: Diagnosis not present

## 2016-11-09 DIAGNOSIS — Z9181 History of falling: Secondary | ICD-10-CM | POA: Diagnosis not present

## 2016-11-11 DIAGNOSIS — M6281 Muscle weakness (generalized): Secondary | ICD-10-CM | POA: Diagnosis not present

## 2016-11-11 DIAGNOSIS — Z9181 History of falling: Secondary | ICD-10-CM | POA: Diagnosis not present

## 2016-11-11 DIAGNOSIS — R2681 Unsteadiness on feet: Secondary | ICD-10-CM | POA: Diagnosis not present

## 2016-11-12 DIAGNOSIS — F0391 Unspecified dementia with behavioral disturbance: Secondary | ICD-10-CM | POA: Diagnosis not present

## 2016-11-12 DIAGNOSIS — R2681 Unsteadiness on feet: Secondary | ICD-10-CM | POA: Diagnosis not present

## 2016-11-12 DIAGNOSIS — M6281 Muscle weakness (generalized): Secondary | ICD-10-CM | POA: Diagnosis not present

## 2016-11-12 DIAGNOSIS — Z9181 History of falling: Secondary | ICD-10-CM | POA: Diagnosis not present

## 2016-11-12 DIAGNOSIS — F419 Anxiety disorder, unspecified: Secondary | ICD-10-CM | POA: Diagnosis not present

## 2016-11-13 DIAGNOSIS — Z9181 History of falling: Secondary | ICD-10-CM | POA: Diagnosis not present

## 2016-11-13 DIAGNOSIS — R2681 Unsteadiness on feet: Secondary | ICD-10-CM | POA: Diagnosis not present

## 2016-11-13 DIAGNOSIS — M6281 Muscle weakness (generalized): Secondary | ICD-10-CM | POA: Diagnosis not present

## 2016-11-14 DIAGNOSIS — R2681 Unsteadiness on feet: Secondary | ICD-10-CM | POA: Diagnosis not present

## 2016-11-14 DIAGNOSIS — Z9181 History of falling: Secondary | ICD-10-CM | POA: Diagnosis not present

## 2016-11-14 DIAGNOSIS — M6281 Muscle weakness (generalized): Secondary | ICD-10-CM | POA: Diagnosis not present

## 2016-11-15 DIAGNOSIS — Z9181 History of falling: Secondary | ICD-10-CM | POA: Diagnosis not present

## 2016-11-15 DIAGNOSIS — M6281 Muscle weakness (generalized): Secondary | ICD-10-CM | POA: Diagnosis not present

## 2016-11-15 DIAGNOSIS — R2681 Unsteadiness on feet: Secondary | ICD-10-CM | POA: Diagnosis not present

## 2016-11-16 DIAGNOSIS — R2681 Unsteadiness on feet: Secondary | ICD-10-CM | POA: Diagnosis not present

## 2016-11-16 DIAGNOSIS — M6281 Muscle weakness (generalized): Secondary | ICD-10-CM | POA: Diagnosis not present

## 2016-11-16 DIAGNOSIS — Z9181 History of falling: Secondary | ICD-10-CM | POA: Diagnosis not present

## 2016-11-19 DIAGNOSIS — Z9181 History of falling: Secondary | ICD-10-CM | POA: Diagnosis not present

## 2016-11-19 DIAGNOSIS — R2681 Unsteadiness on feet: Secondary | ICD-10-CM | POA: Diagnosis not present

## 2016-11-19 DIAGNOSIS — M6281 Muscle weakness (generalized): Secondary | ICD-10-CM | POA: Diagnosis not present

## 2016-11-20 DIAGNOSIS — Z9181 History of falling: Secondary | ICD-10-CM | POA: Diagnosis not present

## 2016-11-20 DIAGNOSIS — M6281 Muscle weakness (generalized): Secondary | ICD-10-CM | POA: Diagnosis not present

## 2016-11-20 DIAGNOSIS — R2681 Unsteadiness on feet: Secondary | ICD-10-CM | POA: Diagnosis not present

## 2016-11-21 DIAGNOSIS — Z9181 History of falling: Secondary | ICD-10-CM | POA: Diagnosis not present

## 2016-11-21 DIAGNOSIS — I10 Essential (primary) hypertension: Secondary | ICD-10-CM | POA: Diagnosis not present

## 2016-11-21 DIAGNOSIS — F419 Anxiety disorder, unspecified: Secondary | ICD-10-CM | POA: Diagnosis not present

## 2016-11-21 DIAGNOSIS — M6281 Muscle weakness (generalized): Secondary | ICD-10-CM | POA: Diagnosis not present

## 2016-11-21 DIAGNOSIS — F0391 Unspecified dementia with behavioral disturbance: Secondary | ICD-10-CM | POA: Diagnosis not present

## 2016-11-21 DIAGNOSIS — R2681 Unsteadiness on feet: Secondary | ICD-10-CM | POA: Diagnosis not present

## 2016-11-21 DIAGNOSIS — F331 Major depressive disorder, recurrent, moderate: Secondary | ICD-10-CM | POA: Diagnosis not present

## 2016-11-21 DIAGNOSIS — F5101 Primary insomnia: Secondary | ICD-10-CM | POA: Diagnosis not present

## 2016-11-22 DIAGNOSIS — R2681 Unsteadiness on feet: Secondary | ICD-10-CM | POA: Diagnosis not present

## 2016-11-22 DIAGNOSIS — Z9181 History of falling: Secondary | ICD-10-CM | POA: Diagnosis not present

## 2016-11-22 DIAGNOSIS — M6281 Muscle weakness (generalized): Secondary | ICD-10-CM | POA: Diagnosis not present

## 2016-11-23 ENCOUNTER — Emergency Department (HOSPITAL_COMMUNITY): Payer: Medicare Other

## 2016-11-23 ENCOUNTER — Emergency Department (HOSPITAL_COMMUNITY)
Admission: EM | Admit: 2016-11-23 | Discharge: 2016-11-23 | Disposition: A | Discharge status: Skilled Nursing Facility | Payer: Medicare Other | Attending: Emergency Medicine | Admitting: Emergency Medicine

## 2016-11-23 ENCOUNTER — Encounter (HOSPITAL_COMMUNITY): Payer: Self-pay

## 2016-11-23 DIAGNOSIS — Y999 Unspecified external cause status: Secondary | ICD-10-CM | POA: Diagnosis not present

## 2016-11-23 DIAGNOSIS — M545 Low back pain: Secondary | ICD-10-CM | POA: Insufficient documentation

## 2016-11-23 DIAGNOSIS — W19XXXA Unspecified fall, initial encounter: Secondary | ICD-10-CM | POA: Diagnosis not present

## 2016-11-23 DIAGNOSIS — I10 Essential (primary) hypertension: Secondary | ICD-10-CM | POA: Insufficient documentation

## 2016-11-23 DIAGNOSIS — R52 Pain, unspecified: Secondary | ICD-10-CM | POA: Diagnosis not present

## 2016-11-23 DIAGNOSIS — S0990XA Unspecified injury of head, initial encounter: Secondary | ICD-10-CM | POA: Diagnosis not present

## 2016-11-23 DIAGNOSIS — Z8673 Personal history of transient ischemic attack (TIA), and cerebral infarction without residual deficits: Secondary | ICD-10-CM | POA: Insufficient documentation

## 2016-11-23 DIAGNOSIS — Y939 Activity, unspecified: Secondary | ICD-10-CM | POA: Diagnosis not present

## 2016-11-23 DIAGNOSIS — S3992XA Unspecified injury of lower back, initial encounter: Secondary | ICD-10-CM | POA: Diagnosis not present

## 2016-11-23 DIAGNOSIS — R531 Weakness: Secondary | ICD-10-CM | POA: Diagnosis not present

## 2016-11-23 DIAGNOSIS — T148XXA Other injury of unspecified body region, initial encounter: Secondary | ICD-10-CM | POA: Diagnosis not present

## 2016-11-23 DIAGNOSIS — F0390 Unspecified dementia without behavioral disturbance: Secondary | ICD-10-CM | POA: Insufficient documentation

## 2016-11-23 DIAGNOSIS — S199XXA Unspecified injury of neck, initial encounter: Secondary | ICD-10-CM | POA: Diagnosis not present

## 2016-11-23 DIAGNOSIS — S81819A Laceration without foreign body, unspecified lower leg, initial encounter: Secondary | ICD-10-CM | POA: Diagnosis not present

## 2016-11-23 DIAGNOSIS — Y929 Unspecified place or not applicable: Secondary | ICD-10-CM | POA: Insufficient documentation

## 2016-11-23 DIAGNOSIS — R259 Unspecified abnormal involuntary movements: Secondary | ICD-10-CM | POA: Diagnosis not present

## 2016-11-23 HISTORY — DX: Cerebral infarction, unspecified: I63.9

## 2016-11-23 HISTORY — DX: Hemorrhage of anus and rectum: K62.5

## 2016-11-23 HISTORY — DX: Other fatigue: R53.83

## 2016-11-23 LAB — I-STAT CHEM 8, ED
BUN: 22 mg/dL — ABNORMAL HIGH (ref 6–20)
CREATININE: 0.7 mg/dL (ref 0.44–1.00)
Calcium, Ion: 1.22 mmol/L (ref 1.15–1.40)
Chloride: 101 mmol/L (ref 101–111)
GLUCOSE: 108 mg/dL — AB (ref 65–99)
HCT: 41 % (ref 36.0–46.0)
HEMOGLOBIN: 13.9 g/dL (ref 12.0–15.0)
Potassium: 4.1 mmol/L (ref 3.5–5.1)
Sodium: 144 mmol/L (ref 135–145)
TCO2: 31 mmol/L (ref 0–100)

## 2016-11-23 NOTE — ED Triage Notes (Signed)
Per EMS Patient is a resident of [facility]. Staff witnessed fall from standing position.PMH Dementia. Staff stated patient at norm. No obvious injuries noted.
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
## 2016-11-23 NOTE — ED Notes (Signed)
[facility] notified of need for transportation to [facility].

## 2016-11-23 NOTE — ED Notes (Signed)
Dr Lita Mains instructed catherization not necessary after talking with family.

## 2016-11-23 NOTE — ED Notes (Signed)
Bed: [facility] Expected date:  Expected time:  Means of arrival:  Comments: 

## 2016-11-23 NOTE — ED Notes (Signed)
Unable to collect vitals at this time. PT would not allow this Probation officer

## 2016-11-26 DIAGNOSIS — R2681 Unsteadiness on feet: Secondary | ICD-10-CM | POA: Diagnosis not present

## 2016-11-26 DIAGNOSIS — Z9181 History of falling: Secondary | ICD-10-CM | POA: Diagnosis not present

## 2016-11-26 DIAGNOSIS — M6281 Muscle weakness (generalized): Secondary | ICD-10-CM | POA: Diagnosis not present

## 2016-11-27 DIAGNOSIS — F339 Major depressive disorder, recurrent, unspecified: Secondary | ICD-10-CM | POA: Diagnosis not present

## 2016-11-27 DIAGNOSIS — H04129 Dry eye syndrome of unspecified lacrimal gland: Secondary | ICD-10-CM | POA: Diagnosis not present

## 2016-11-27 DIAGNOSIS — I672 Cerebral atherosclerosis: Secondary | ICD-10-CM | POA: Diagnosis not present

## 2016-11-27 DIAGNOSIS — R2681 Unsteadiness on feet: Secondary | ICD-10-CM | POA: Diagnosis not present

## 2016-11-27 DIAGNOSIS — Z9181 History of falling: Secondary | ICD-10-CM | POA: Diagnosis not present

## 2016-11-27 DIAGNOSIS — I679 Cerebrovascular disease, unspecified: Secondary | ICD-10-CM | POA: Diagnosis not present

## 2016-11-27 DIAGNOSIS — M6281 Muscle weakness (generalized): Secondary | ICD-10-CM | POA: Diagnosis not present

## 2016-11-27 DIAGNOSIS — R1310 Dysphagia, unspecified: Secondary | ICD-10-CM | POA: Diagnosis not present

## 2016-11-27 DIAGNOSIS — G309 Alzheimer's disease, unspecified: Secondary | ICD-10-CM | POA: Diagnosis not present

## 2016-11-28 DIAGNOSIS — I672 Cerebral atherosclerosis: Secondary | ICD-10-CM | POA: Diagnosis not present

## 2016-11-28 DIAGNOSIS — Z79899 Other long term (current) drug therapy: Secondary | ICD-10-CM | POA: Diagnosis not present

## 2016-11-28 DIAGNOSIS — G309 Alzheimer's disease, unspecified: Secondary | ICD-10-CM | POA: Diagnosis not present

## 2016-11-28 DIAGNOSIS — F339 Major depressive disorder, recurrent, unspecified: Secondary | ICD-10-CM | POA: Diagnosis not present

## 2016-11-28 DIAGNOSIS — R1310 Dysphagia, unspecified: Secondary | ICD-10-CM | POA: Diagnosis not present

## 2016-11-28 DIAGNOSIS — H04129 Dry eye syndrome of unspecified lacrimal gland: Secondary | ICD-10-CM | POA: Diagnosis not present

## 2016-11-28 DIAGNOSIS — I679 Cerebrovascular disease, unspecified: Secondary | ICD-10-CM | POA: Diagnosis not present

## 2016-11-29 DIAGNOSIS — H04129 Dry eye syndrome of unspecified lacrimal gland: Secondary | ICD-10-CM | POA: Diagnosis not present

## 2016-11-29 DIAGNOSIS — I672 Cerebral atherosclerosis: Secondary | ICD-10-CM | POA: Diagnosis not present

## 2016-11-29 DIAGNOSIS — G309 Alzheimer's disease, unspecified: Secondary | ICD-10-CM | POA: Diagnosis not present

## 2016-11-29 DIAGNOSIS — F339 Major depressive disorder, recurrent, unspecified: Secondary | ICD-10-CM | POA: Diagnosis not present

## 2016-11-29 DIAGNOSIS — I679 Cerebrovascular disease, unspecified: Secondary | ICD-10-CM | POA: Diagnosis not present

## 2016-11-29 DIAGNOSIS — R1310 Dysphagia, unspecified: Secondary | ICD-10-CM | POA: Diagnosis not present

## 2016-12-04 DIAGNOSIS — F339 Major depressive disorder, recurrent, unspecified: Secondary | ICD-10-CM | POA: Diagnosis not present

## 2016-12-04 DIAGNOSIS — R1310 Dysphagia, unspecified: Secondary | ICD-10-CM | POA: Diagnosis not present

## 2016-12-04 DIAGNOSIS — I672 Cerebral atherosclerosis: Secondary | ICD-10-CM | POA: Diagnosis not present

## 2016-12-04 DIAGNOSIS — I679 Cerebrovascular disease, unspecified: Secondary | ICD-10-CM | POA: Diagnosis not present

## 2016-12-04 DIAGNOSIS — G309 Alzheimer's disease, unspecified: Secondary | ICD-10-CM | POA: Diagnosis not present

## 2016-12-04 DIAGNOSIS — H04129 Dry eye syndrome of unspecified lacrimal gland: Secondary | ICD-10-CM | POA: Diagnosis not present

## 2016-12-05 DIAGNOSIS — M8580 Other specified disorders of bone density and structure, unspecified site: Secondary | ICD-10-CM | POA: Diagnosis not present

## 2016-12-05 DIAGNOSIS — I10 Essential (primary) hypertension: Secondary | ICD-10-CM | POA: Diagnosis not present

## 2016-12-05 DIAGNOSIS — Z79899 Other long term (current) drug therapy: Secondary | ICD-10-CM | POA: Diagnosis not present

## 2016-12-05 DIAGNOSIS — K922 Gastrointestinal hemorrhage, unspecified: Secondary | ICD-10-CM | POA: Diagnosis not present

## 2016-12-05 DIAGNOSIS — F0390 Unspecified dementia without behavioral disturbance: Secondary | ICD-10-CM | POA: Diagnosis not present

## 2016-12-05 DIAGNOSIS — K219 Gastro-esophageal reflux disease without esophagitis: Secondary | ICD-10-CM | POA: Diagnosis not present

## 2016-12-05 DIAGNOSIS — R296 Repeated falls: Secondary | ICD-10-CM | POA: Diagnosis not present

## 2016-12-06 DIAGNOSIS — R1310 Dysphagia, unspecified: Secondary | ICD-10-CM | POA: Diagnosis not present

## 2016-12-06 DIAGNOSIS — G309 Alzheimer's disease, unspecified: Secondary | ICD-10-CM | POA: Diagnosis not present

## 2016-12-06 DIAGNOSIS — I679 Cerebrovascular disease, unspecified: Secondary | ICD-10-CM | POA: Diagnosis not present

## 2016-12-06 DIAGNOSIS — I672 Cerebral atherosclerosis: Secondary | ICD-10-CM | POA: Diagnosis not present

## 2016-12-06 DIAGNOSIS — F339 Major depressive disorder, recurrent, unspecified: Secondary | ICD-10-CM | POA: Diagnosis not present

## 2016-12-06 DIAGNOSIS — H04129 Dry eye syndrome of unspecified lacrimal gland: Secondary | ICD-10-CM | POA: Diagnosis not present

## 2016-12-10 DIAGNOSIS — F339 Major depressive disorder, recurrent, unspecified: Secondary | ICD-10-CM | POA: Diagnosis not present

## 2016-12-10 DIAGNOSIS — F419 Anxiety disorder, unspecified: Secondary | ICD-10-CM | POA: Diagnosis not present

## 2016-12-10 DIAGNOSIS — H04129 Dry eye syndrome of unspecified lacrimal gland: Secondary | ICD-10-CM | POA: Diagnosis not present

## 2016-12-10 DIAGNOSIS — R1310 Dysphagia, unspecified: Secondary | ICD-10-CM | POA: Diagnosis not present

## 2016-12-10 DIAGNOSIS — F0391 Unspecified dementia with behavioral disturbance: Secondary | ICD-10-CM | POA: Diagnosis not present

## 2016-12-10 DIAGNOSIS — I679 Cerebrovascular disease, unspecified: Secondary | ICD-10-CM | POA: Diagnosis not present

## 2016-12-10 DIAGNOSIS — I672 Cerebral atherosclerosis: Secondary | ICD-10-CM | POA: Diagnosis not present

## 2016-12-10 DIAGNOSIS — G309 Alzheimer's disease, unspecified: Secondary | ICD-10-CM | POA: Diagnosis not present

## 2016-12-11 ENCOUNTER — Telehealth: Payer: Self-pay | Admitting: Internal Medicine

## 2016-12-11 NOTE — Telephone Encounter (Signed)
Called patient to schedule annual wellness. Lvm for patient to call office to schedule appt.

## 2016-12-12 DIAGNOSIS — I672 Cerebral atherosclerosis: Secondary | ICD-10-CM | POA: Diagnosis not present

## 2016-12-12 DIAGNOSIS — R1310 Dysphagia, unspecified: Secondary | ICD-10-CM | POA: Diagnosis not present

## 2016-12-12 DIAGNOSIS — F339 Major depressive disorder, recurrent, unspecified: Secondary | ICD-10-CM | POA: Diagnosis not present

## 2016-12-12 DIAGNOSIS — I679 Cerebrovascular disease, unspecified: Secondary | ICD-10-CM | POA: Diagnosis not present

## 2016-12-12 DIAGNOSIS — H04129 Dry eye syndrome of unspecified lacrimal gland: Secondary | ICD-10-CM | POA: Diagnosis not present

## 2016-12-12 DIAGNOSIS — G309 Alzheimer's disease, unspecified: Secondary | ICD-10-CM | POA: Diagnosis not present

## 2016-12-13 DIAGNOSIS — F339 Major depressive disorder, recurrent, unspecified: Secondary | ICD-10-CM | POA: Diagnosis not present

## 2016-12-13 DIAGNOSIS — I672 Cerebral atherosclerosis: Secondary | ICD-10-CM | POA: Diagnosis not present

## 2016-12-13 DIAGNOSIS — I679 Cerebrovascular disease, unspecified: Secondary | ICD-10-CM | POA: Diagnosis not present

## 2016-12-13 DIAGNOSIS — R1310 Dysphagia, unspecified: Secondary | ICD-10-CM | POA: Diagnosis not present

## 2016-12-13 DIAGNOSIS — G309 Alzheimer's disease, unspecified: Secondary | ICD-10-CM | POA: Diagnosis not present

## 2016-12-13 DIAGNOSIS — H04129 Dry eye syndrome of unspecified lacrimal gland: Secondary | ICD-10-CM | POA: Diagnosis not present

## 2016-12-14 ENCOUNTER — Telehealth: Payer: Self-pay | Admitting: Internal Medicine

## 2016-12-14 DIAGNOSIS — F339 Major depressive disorder, recurrent, unspecified: Secondary | ICD-10-CM | POA: Diagnosis not present

## 2016-12-14 DIAGNOSIS — R1310 Dysphagia, unspecified: Secondary | ICD-10-CM | POA: Diagnosis not present

## 2016-12-14 DIAGNOSIS — H04129 Dry eye syndrome of unspecified lacrimal gland: Secondary | ICD-10-CM | POA: Diagnosis not present

## 2016-12-14 DIAGNOSIS — I672 Cerebral atherosclerosis: Secondary | ICD-10-CM | POA: Diagnosis not present

## 2016-12-14 DIAGNOSIS — G309 Alzheimer's disease, unspecified: Secondary | ICD-10-CM | POA: Diagnosis not present

## 2016-12-14 DIAGNOSIS — I679 Cerebrovascular disease, unspecified: Secondary | ICD-10-CM | POA: Diagnosis not present

## 2016-12-14 NOTE — Telephone Encounter (Signed)
Spoke with patient's son. He stated that patient is in a facility at this time and will not be interested in scheduling an annual wellness appt.

## 2016-12-15 DIAGNOSIS — R1310 Dysphagia, unspecified: Secondary | ICD-10-CM | POA: Diagnosis not present

## 2016-12-15 DIAGNOSIS — I672 Cerebral atherosclerosis: Secondary | ICD-10-CM | POA: Diagnosis not present

## 2016-12-15 DIAGNOSIS — G309 Alzheimer's disease, unspecified: Secondary | ICD-10-CM | POA: Diagnosis not present

## 2016-12-15 DIAGNOSIS — I679 Cerebrovascular disease, unspecified: Secondary | ICD-10-CM | POA: Diagnosis not present

## 2016-12-15 DIAGNOSIS — F339 Major depressive disorder, recurrent, unspecified: Secondary | ICD-10-CM | POA: Diagnosis not present

## 2016-12-15 DIAGNOSIS — H04129 Dry eye syndrome of unspecified lacrimal gland: Secondary | ICD-10-CM | POA: Diagnosis not present

## 2016-12-17 DIAGNOSIS — R1310 Dysphagia, unspecified: Secondary | ICD-10-CM | POA: Diagnosis not present

## 2016-12-17 DIAGNOSIS — G309 Alzheimer's disease, unspecified: Secondary | ICD-10-CM | POA: Diagnosis not present

## 2016-12-17 DIAGNOSIS — I679 Cerebrovascular disease, unspecified: Secondary | ICD-10-CM | POA: Diagnosis not present

## 2016-12-17 DIAGNOSIS — F339 Major depressive disorder, recurrent, unspecified: Secondary | ICD-10-CM | POA: Diagnosis not present

## 2016-12-17 DIAGNOSIS — H04129 Dry eye syndrome of unspecified lacrimal gland: Secondary | ICD-10-CM | POA: Diagnosis not present

## 2016-12-17 DIAGNOSIS — I672 Cerebral atherosclerosis: Secondary | ICD-10-CM | POA: Diagnosis not present

## 2016-12-19 DIAGNOSIS — F5101 Primary insomnia: Secondary | ICD-10-CM | POA: Diagnosis not present

## 2016-12-19 DIAGNOSIS — F419 Anxiety disorder, unspecified: Secondary | ICD-10-CM | POA: Diagnosis not present

## 2016-12-19 DIAGNOSIS — I679 Cerebrovascular disease, unspecified: Secondary | ICD-10-CM | POA: Diagnosis not present

## 2016-12-19 DIAGNOSIS — G309 Alzheimer's disease, unspecified: Secondary | ICD-10-CM | POA: Diagnosis not present

## 2016-12-19 DIAGNOSIS — F339 Major depressive disorder, recurrent, unspecified: Secondary | ICD-10-CM | POA: Diagnosis not present

## 2016-12-19 DIAGNOSIS — I672 Cerebral atherosclerosis: Secondary | ICD-10-CM | POA: Diagnosis not present

## 2016-12-19 DIAGNOSIS — F0391 Unspecified dementia with behavioral disturbance: Secondary | ICD-10-CM | POA: Diagnosis not present

## 2016-12-19 DIAGNOSIS — H04129 Dry eye syndrome of unspecified lacrimal gland: Secondary | ICD-10-CM | POA: Diagnosis not present

## 2016-12-19 DIAGNOSIS — R1310 Dysphagia, unspecified: Secondary | ICD-10-CM | POA: Diagnosis not present

## 2016-12-19 DIAGNOSIS — F331 Major depressive disorder, recurrent, moderate: Secondary | ICD-10-CM | POA: Diagnosis not present

## 2016-12-22 DIAGNOSIS — H04129 Dry eye syndrome of unspecified lacrimal gland: Secondary | ICD-10-CM | POA: Diagnosis not present

## 2016-12-22 DIAGNOSIS — I672 Cerebral atherosclerosis: Secondary | ICD-10-CM | POA: Diagnosis not present

## 2016-12-22 DIAGNOSIS — R1310 Dysphagia, unspecified: Secondary | ICD-10-CM | POA: Diagnosis not present

## 2016-12-22 DIAGNOSIS — F339 Major depressive disorder, recurrent, unspecified: Secondary | ICD-10-CM | POA: Diagnosis not present

## 2016-12-22 DIAGNOSIS — G309 Alzheimer's disease, unspecified: Secondary | ICD-10-CM | POA: Diagnosis not present

## 2016-12-22 DIAGNOSIS — I679 Cerebrovascular disease, unspecified: Secondary | ICD-10-CM | POA: Diagnosis not present

## 2016-12-26 DIAGNOSIS — R1310 Dysphagia, unspecified: Secondary | ICD-10-CM | POA: Diagnosis not present

## 2016-12-26 DIAGNOSIS — F339 Major depressive disorder, recurrent, unspecified: Secondary | ICD-10-CM | POA: Diagnosis not present

## 2016-12-26 DIAGNOSIS — I672 Cerebral atherosclerosis: Secondary | ICD-10-CM | POA: Diagnosis not present

## 2016-12-26 DIAGNOSIS — G309 Alzheimer's disease, unspecified: Secondary | ICD-10-CM | POA: Diagnosis not present

## 2016-12-26 DIAGNOSIS — I679 Cerebrovascular disease, unspecified: Secondary | ICD-10-CM | POA: Diagnosis not present

## 2016-12-26 DIAGNOSIS — H04129 Dry eye syndrome of unspecified lacrimal gland: Secondary | ICD-10-CM | POA: Diagnosis not present

## 2016-12-27 DIAGNOSIS — G309 Alzheimer's disease, unspecified: Secondary | ICD-10-CM | POA: Diagnosis not present

## 2016-12-27 DIAGNOSIS — I672 Cerebral atherosclerosis: Secondary | ICD-10-CM | POA: Diagnosis not present

## 2016-12-27 DIAGNOSIS — F339 Major depressive disorder, recurrent, unspecified: Secondary | ICD-10-CM | POA: Diagnosis not present

## 2016-12-27 DIAGNOSIS — R1310 Dysphagia, unspecified: Secondary | ICD-10-CM | POA: Diagnosis not present

## 2016-12-27 DIAGNOSIS — H04129 Dry eye syndrome of unspecified lacrimal gland: Secondary | ICD-10-CM | POA: Diagnosis not present

## 2016-12-27 DIAGNOSIS — I679 Cerebrovascular disease, unspecified: Secondary | ICD-10-CM | POA: Diagnosis not present

## 2016-12-28 DIAGNOSIS — F339 Major depressive disorder, recurrent, unspecified: Secondary | ICD-10-CM | POA: Diagnosis not present

## 2016-12-28 DIAGNOSIS — R1310 Dysphagia, unspecified: Secondary | ICD-10-CM | POA: Diagnosis not present

## 2016-12-28 DIAGNOSIS — I672 Cerebral atherosclerosis: Secondary | ICD-10-CM | POA: Diagnosis not present

## 2016-12-28 DIAGNOSIS — I679 Cerebrovascular disease, unspecified: Secondary | ICD-10-CM | POA: Diagnosis not present

## 2016-12-28 DIAGNOSIS — H04129 Dry eye syndrome of unspecified lacrimal gland: Secondary | ICD-10-CM | POA: Diagnosis not present

## 2016-12-28 DIAGNOSIS — G309 Alzheimer's disease, unspecified: Secondary | ICD-10-CM | POA: Diagnosis not present

## 2016-12-29 DIAGNOSIS — H04129 Dry eye syndrome of unspecified lacrimal gland: Secondary | ICD-10-CM | POA: Diagnosis not present

## 2016-12-29 DIAGNOSIS — I672 Cerebral atherosclerosis: Secondary | ICD-10-CM | POA: Diagnosis not present

## 2016-12-29 DIAGNOSIS — R1310 Dysphagia, unspecified: Secondary | ICD-10-CM | POA: Diagnosis not present

## 2016-12-29 DIAGNOSIS — I679 Cerebrovascular disease, unspecified: Secondary | ICD-10-CM | POA: Diagnosis not present

## 2016-12-29 DIAGNOSIS — G309 Alzheimer's disease, unspecified: Secondary | ICD-10-CM | POA: Diagnosis not present

## 2016-12-29 DIAGNOSIS — F339 Major depressive disorder, recurrent, unspecified: Secondary | ICD-10-CM | POA: Diagnosis not present

## 2016-12-31 DIAGNOSIS — I672 Cerebral atherosclerosis: Secondary | ICD-10-CM | POA: Diagnosis not present

## 2016-12-31 DIAGNOSIS — F339 Major depressive disorder, recurrent, unspecified: Secondary | ICD-10-CM | POA: Diagnosis not present

## 2016-12-31 DIAGNOSIS — I679 Cerebrovascular disease, unspecified: Secondary | ICD-10-CM | POA: Diagnosis not present

## 2016-12-31 DIAGNOSIS — G309 Alzheimer's disease, unspecified: Secondary | ICD-10-CM | POA: Diagnosis not present

## 2016-12-31 DIAGNOSIS — R1310 Dysphagia, unspecified: Secondary | ICD-10-CM | POA: Diagnosis not present

## 2016-12-31 DIAGNOSIS — H04129 Dry eye syndrome of unspecified lacrimal gland: Secondary | ICD-10-CM | POA: Diagnosis not present

## 2017-01-02 DIAGNOSIS — M8580 Other specified disorders of bone density and structure, unspecified site: Secondary | ICD-10-CM | POA: Diagnosis not present

## 2017-01-02 DIAGNOSIS — F0390 Unspecified dementia without behavioral disturbance: Secondary | ICD-10-CM | POA: Diagnosis not present

## 2017-01-02 DIAGNOSIS — F5101 Primary insomnia: Secondary | ICD-10-CM | POA: Diagnosis not present

## 2017-01-02 DIAGNOSIS — F419 Anxiety disorder, unspecified: Secondary | ICD-10-CM | POA: Diagnosis not present

## 2017-01-02 DIAGNOSIS — R296 Repeated falls: Secondary | ICD-10-CM | POA: Diagnosis not present

## 2017-01-02 DIAGNOSIS — F331 Major depressive disorder, recurrent, moderate: Secondary | ICD-10-CM | POA: Diagnosis not present

## 2017-01-02 DIAGNOSIS — F0391 Unspecified dementia with behavioral disturbance: Secondary | ICD-10-CM | POA: Diagnosis not present

## 2017-01-02 DIAGNOSIS — I10 Essential (primary) hypertension: Secondary | ICD-10-CM | POA: Diagnosis not present

## 2017-01-02 DIAGNOSIS — H9193 Unspecified hearing loss, bilateral: Secondary | ICD-10-CM | POA: Diagnosis not present

## 2017-01-03 DIAGNOSIS — I672 Cerebral atherosclerosis: Secondary | ICD-10-CM | POA: Diagnosis not present

## 2017-01-03 DIAGNOSIS — G309 Alzheimer's disease, unspecified: Secondary | ICD-10-CM | POA: Diagnosis not present

## 2017-01-03 DIAGNOSIS — F339 Major depressive disorder, recurrent, unspecified: Secondary | ICD-10-CM | POA: Diagnosis not present

## 2017-01-03 DIAGNOSIS — R1310 Dysphagia, unspecified: Secondary | ICD-10-CM | POA: Diagnosis not present

## 2017-01-03 DIAGNOSIS — H04129 Dry eye syndrome of unspecified lacrimal gland: Secondary | ICD-10-CM | POA: Diagnosis not present

## 2017-01-03 DIAGNOSIS — I679 Cerebrovascular disease, unspecified: Secondary | ICD-10-CM | POA: Diagnosis not present

## 2017-01-07 DIAGNOSIS — H04129 Dry eye syndrome of unspecified lacrimal gland: Secondary | ICD-10-CM | POA: Diagnosis not present

## 2017-01-07 DIAGNOSIS — I679 Cerebrovascular disease, unspecified: Secondary | ICD-10-CM | POA: Diagnosis not present

## 2017-01-07 DIAGNOSIS — G309 Alzheimer's disease, unspecified: Secondary | ICD-10-CM | POA: Diagnosis not present

## 2017-01-07 DIAGNOSIS — R1310 Dysphagia, unspecified: Secondary | ICD-10-CM | POA: Diagnosis not present

## 2017-01-07 DIAGNOSIS — I672 Cerebral atherosclerosis: Secondary | ICD-10-CM | POA: Diagnosis not present

## 2017-01-07 DIAGNOSIS — F339 Major depressive disorder, recurrent, unspecified: Secondary | ICD-10-CM | POA: Diagnosis not present

## 2017-01-08 DIAGNOSIS — I672 Cerebral atherosclerosis: Secondary | ICD-10-CM | POA: Diagnosis not present

## 2017-01-08 DIAGNOSIS — F419 Anxiety disorder, unspecified: Secondary | ICD-10-CM | POA: Diagnosis not present

## 2017-01-08 DIAGNOSIS — F339 Major depressive disorder, recurrent, unspecified: Secondary | ICD-10-CM | POA: Diagnosis not present

## 2017-01-08 DIAGNOSIS — I679 Cerebrovascular disease, unspecified: Secondary | ICD-10-CM | POA: Diagnosis not present

## 2017-01-08 DIAGNOSIS — H04129 Dry eye syndrome of unspecified lacrimal gland: Secondary | ICD-10-CM | POA: Diagnosis not present

## 2017-01-08 DIAGNOSIS — G309 Alzheimer's disease, unspecified: Secondary | ICD-10-CM | POA: Diagnosis not present

## 2017-01-08 DIAGNOSIS — R1310 Dysphagia, unspecified: Secondary | ICD-10-CM | POA: Diagnosis not present

## 2017-01-09 DIAGNOSIS — R1310 Dysphagia, unspecified: Secondary | ICD-10-CM | POA: Diagnosis not present

## 2017-01-09 DIAGNOSIS — H04129 Dry eye syndrome of unspecified lacrimal gland: Secondary | ICD-10-CM | POA: Diagnosis not present

## 2017-01-09 DIAGNOSIS — I679 Cerebrovascular disease, unspecified: Secondary | ICD-10-CM | POA: Diagnosis not present

## 2017-01-09 DIAGNOSIS — F339 Major depressive disorder, recurrent, unspecified: Secondary | ICD-10-CM | POA: Diagnosis not present

## 2017-01-09 DIAGNOSIS — G309 Alzheimer's disease, unspecified: Secondary | ICD-10-CM | POA: Diagnosis not present

## 2017-01-09 DIAGNOSIS — I672 Cerebral atherosclerosis: Secondary | ICD-10-CM | POA: Diagnosis not present

## 2017-01-10 DIAGNOSIS — I679 Cerebrovascular disease, unspecified: Secondary | ICD-10-CM | POA: Diagnosis not present

## 2017-01-10 DIAGNOSIS — H04129 Dry eye syndrome of unspecified lacrimal gland: Secondary | ICD-10-CM | POA: Diagnosis not present

## 2017-01-10 DIAGNOSIS — G309 Alzheimer's disease, unspecified: Secondary | ICD-10-CM | POA: Diagnosis not present

## 2017-01-10 DIAGNOSIS — R1310 Dysphagia, unspecified: Secondary | ICD-10-CM | POA: Diagnosis not present

## 2017-01-10 DIAGNOSIS — F339 Major depressive disorder, recurrent, unspecified: Secondary | ICD-10-CM | POA: Diagnosis not present

## 2017-01-10 DIAGNOSIS — I672 Cerebral atherosclerosis: Secondary | ICD-10-CM | POA: Diagnosis not present

## 2017-01-13 DIAGNOSIS — I679 Cerebrovascular disease, unspecified: Secondary | ICD-10-CM | POA: Diagnosis not present

## 2017-01-13 DIAGNOSIS — G309 Alzheimer's disease, unspecified: Secondary | ICD-10-CM | POA: Diagnosis not present

## 2017-01-13 DIAGNOSIS — F339 Major depressive disorder, recurrent, unspecified: Secondary | ICD-10-CM | POA: Diagnosis not present

## 2017-01-13 DIAGNOSIS — I672 Cerebral atherosclerosis: Secondary | ICD-10-CM | POA: Diagnosis not present

## 2017-01-13 DIAGNOSIS — R1310 Dysphagia, unspecified: Secondary | ICD-10-CM | POA: Diagnosis not present

## 2017-01-13 DIAGNOSIS — H04129 Dry eye syndrome of unspecified lacrimal gland: Secondary | ICD-10-CM | POA: Diagnosis not present

## 2017-01-14 DIAGNOSIS — F339 Major depressive disorder, recurrent, unspecified: Secondary | ICD-10-CM | POA: Diagnosis not present

## 2017-01-14 DIAGNOSIS — R1310 Dysphagia, unspecified: Secondary | ICD-10-CM | POA: Diagnosis not present

## 2017-01-14 DIAGNOSIS — G309 Alzheimer's disease, unspecified: Secondary | ICD-10-CM | POA: Diagnosis not present

## 2017-01-14 DIAGNOSIS — I679 Cerebrovascular disease, unspecified: Secondary | ICD-10-CM | POA: Diagnosis not present

## 2017-01-14 DIAGNOSIS — H04129 Dry eye syndrome of unspecified lacrimal gland: Secondary | ICD-10-CM | POA: Diagnosis not present

## 2017-01-14 DIAGNOSIS — I672 Cerebral atherosclerosis: Secondary | ICD-10-CM | POA: Diagnosis not present

## 2017-01-17 DIAGNOSIS — I672 Cerebral atherosclerosis: Secondary | ICD-10-CM | POA: Diagnosis not present

## 2017-01-17 DIAGNOSIS — H04129 Dry eye syndrome of unspecified lacrimal gland: Secondary | ICD-10-CM | POA: Diagnosis not present

## 2017-01-17 DIAGNOSIS — F339 Major depressive disorder, recurrent, unspecified: Secondary | ICD-10-CM | POA: Diagnosis not present

## 2017-01-17 DIAGNOSIS — I679 Cerebrovascular disease, unspecified: Secondary | ICD-10-CM | POA: Diagnosis not present

## 2017-01-17 DIAGNOSIS — R1310 Dysphagia, unspecified: Secondary | ICD-10-CM | POA: Diagnosis not present

## 2017-01-17 DIAGNOSIS — G309 Alzheimer's disease, unspecified: Secondary | ICD-10-CM | POA: Diagnosis not present

## 2017-01-18 DIAGNOSIS — I672 Cerebral atherosclerosis: Secondary | ICD-10-CM | POA: Diagnosis not present

## 2017-01-18 DIAGNOSIS — H04129 Dry eye syndrome of unspecified lacrimal gland: Secondary | ICD-10-CM | POA: Diagnosis not present

## 2017-01-18 DIAGNOSIS — G309 Alzheimer's disease, unspecified: Secondary | ICD-10-CM | POA: Diagnosis not present

## 2017-01-18 DIAGNOSIS — F339 Major depressive disorder, recurrent, unspecified: Secondary | ICD-10-CM | POA: Diagnosis not present

## 2017-01-18 DIAGNOSIS — R1310 Dysphagia, unspecified: Secondary | ICD-10-CM | POA: Diagnosis not present

## 2017-01-18 DIAGNOSIS — I679 Cerebrovascular disease, unspecified: Secondary | ICD-10-CM | POA: Diagnosis not present

## 2017-01-22 DIAGNOSIS — I672 Cerebral atherosclerosis: Secondary | ICD-10-CM | POA: Diagnosis not present

## 2017-01-22 DIAGNOSIS — F339 Major depressive disorder, recurrent, unspecified: Secondary | ICD-10-CM | POA: Diagnosis not present

## 2017-01-22 DIAGNOSIS — I679 Cerebrovascular disease, unspecified: Secondary | ICD-10-CM | POA: Diagnosis not present

## 2017-01-22 DIAGNOSIS — G309 Alzheimer's disease, unspecified: Secondary | ICD-10-CM | POA: Diagnosis not present

## 2017-01-22 DIAGNOSIS — H04129 Dry eye syndrome of unspecified lacrimal gland: Secondary | ICD-10-CM | POA: Diagnosis not present

## 2017-01-22 DIAGNOSIS — R1310 Dysphagia, unspecified: Secondary | ICD-10-CM | POA: Diagnosis not present

## 2017-01-23 DIAGNOSIS — H04129 Dry eye syndrome of unspecified lacrimal gland: Secondary | ICD-10-CM | POA: Diagnosis not present

## 2017-01-23 DIAGNOSIS — G309 Alzheimer's disease, unspecified: Secondary | ICD-10-CM | POA: Diagnosis not present

## 2017-01-23 DIAGNOSIS — I679 Cerebrovascular disease, unspecified: Secondary | ICD-10-CM | POA: Diagnosis not present

## 2017-01-23 DIAGNOSIS — I672 Cerebral atherosclerosis: Secondary | ICD-10-CM | POA: Diagnosis not present

## 2017-01-23 DIAGNOSIS — F339 Major depressive disorder, recurrent, unspecified: Secondary | ICD-10-CM | POA: Diagnosis not present

## 2017-01-23 DIAGNOSIS — R1310 Dysphagia, unspecified: Secondary | ICD-10-CM | POA: Diagnosis not present

## 2017-01-25 DIAGNOSIS — I679 Cerebrovascular disease, unspecified: Secondary | ICD-10-CM | POA: Diagnosis not present

## 2017-01-25 DIAGNOSIS — R1310 Dysphagia, unspecified: Secondary | ICD-10-CM | POA: Diagnosis not present

## 2017-01-25 DIAGNOSIS — H04129 Dry eye syndrome of unspecified lacrimal gland: Secondary | ICD-10-CM | POA: Diagnosis not present

## 2017-01-25 DIAGNOSIS — F339 Major depressive disorder, recurrent, unspecified: Secondary | ICD-10-CM | POA: Diagnosis not present

## 2017-01-25 DIAGNOSIS — G309 Alzheimer's disease, unspecified: Secondary | ICD-10-CM | POA: Diagnosis not present

## 2017-01-25 DIAGNOSIS — I672 Cerebral atherosclerosis: Secondary | ICD-10-CM | POA: Diagnosis not present

## 2017-01-26 DIAGNOSIS — G309 Alzheimer's disease, unspecified: Secondary | ICD-10-CM | POA: Diagnosis not present

## 2017-01-26 DIAGNOSIS — I672 Cerebral atherosclerosis: Secondary | ICD-10-CM | POA: Diagnosis not present

## 2017-01-26 DIAGNOSIS — F339 Major depressive disorder, recurrent, unspecified: Secondary | ICD-10-CM | POA: Diagnosis not present

## 2017-01-26 DIAGNOSIS — H04129 Dry eye syndrome of unspecified lacrimal gland: Secondary | ICD-10-CM | POA: Diagnosis not present

## 2017-01-26 DIAGNOSIS — I679 Cerebrovascular disease, unspecified: Secondary | ICD-10-CM | POA: Diagnosis not present

## 2017-01-26 DIAGNOSIS — R1310 Dysphagia, unspecified: Secondary | ICD-10-CM | POA: Diagnosis not present

## 2017-01-28 DIAGNOSIS — I672 Cerebral atherosclerosis: Secondary | ICD-10-CM | POA: Diagnosis not present

## 2017-01-28 DIAGNOSIS — H04129 Dry eye syndrome of unspecified lacrimal gland: Secondary | ICD-10-CM | POA: Diagnosis not present

## 2017-01-28 DIAGNOSIS — G309 Alzheimer's disease, unspecified: Secondary | ICD-10-CM | POA: Diagnosis not present

## 2017-01-28 DIAGNOSIS — R1310 Dysphagia, unspecified: Secondary | ICD-10-CM | POA: Diagnosis not present

## 2017-01-28 DIAGNOSIS — I679 Cerebrovascular disease, unspecified: Secondary | ICD-10-CM | POA: Diagnosis not present

## 2017-01-28 DIAGNOSIS — F339 Major depressive disorder, recurrent, unspecified: Secondary | ICD-10-CM | POA: Diagnosis not present

## 2017-01-29 DIAGNOSIS — M2010 Hallux valgus (acquired), unspecified foot: Secondary | ICD-10-CM | POA: Diagnosis not present

## 2017-01-29 DIAGNOSIS — L603 Nail dystrophy: Secondary | ICD-10-CM | POA: Diagnosis not present

## 2017-01-29 DIAGNOSIS — B351 Tinea unguium: Secondary | ICD-10-CM | POA: Diagnosis not present

## 2017-01-29 DIAGNOSIS — Q845 Enlarged and hypertrophic nails: Secondary | ICD-10-CM | POA: Diagnosis not present

## 2017-01-30 DIAGNOSIS — I679 Cerebrovascular disease, unspecified: Secondary | ICD-10-CM | POA: Diagnosis not present

## 2017-01-30 DIAGNOSIS — F339 Major depressive disorder, recurrent, unspecified: Secondary | ICD-10-CM | POA: Diagnosis not present

## 2017-01-30 DIAGNOSIS — I672 Cerebral atherosclerosis: Secondary | ICD-10-CM | POA: Diagnosis not present

## 2017-01-30 DIAGNOSIS — G309 Alzheimer's disease, unspecified: Secondary | ICD-10-CM | POA: Diagnosis not present

## 2017-01-30 DIAGNOSIS — H04129 Dry eye syndrome of unspecified lacrimal gland: Secondary | ICD-10-CM | POA: Diagnosis not present

## 2017-01-30 DIAGNOSIS — R1310 Dysphagia, unspecified: Secondary | ICD-10-CM | POA: Diagnosis not present

## 2017-02-01 DIAGNOSIS — G309 Alzheimer's disease, unspecified: Secondary | ICD-10-CM | POA: Diagnosis not present

## 2017-02-01 DIAGNOSIS — I672 Cerebral atherosclerosis: Secondary | ICD-10-CM | POA: Diagnosis not present

## 2017-02-01 DIAGNOSIS — H04129 Dry eye syndrome of unspecified lacrimal gland: Secondary | ICD-10-CM | POA: Diagnosis not present

## 2017-02-01 DIAGNOSIS — F339 Major depressive disorder, recurrent, unspecified: Secondary | ICD-10-CM | POA: Diagnosis not present

## 2017-02-01 DIAGNOSIS — R1310 Dysphagia, unspecified: Secondary | ICD-10-CM | POA: Diagnosis not present

## 2017-02-01 DIAGNOSIS — I679 Cerebrovascular disease, unspecified: Secondary | ICD-10-CM | POA: Diagnosis not present

## 2017-02-04 DIAGNOSIS — I672 Cerebral atherosclerosis: Secondary | ICD-10-CM | POA: Diagnosis not present

## 2017-02-04 DIAGNOSIS — G309 Alzheimer's disease, unspecified: Secondary | ICD-10-CM | POA: Diagnosis not present

## 2017-02-04 DIAGNOSIS — F339 Major depressive disorder, recurrent, unspecified: Secondary | ICD-10-CM | POA: Diagnosis not present

## 2017-02-04 DIAGNOSIS — H04129 Dry eye syndrome of unspecified lacrimal gland: Secondary | ICD-10-CM | POA: Diagnosis not present

## 2017-02-04 DIAGNOSIS — I679 Cerebrovascular disease, unspecified: Secondary | ICD-10-CM | POA: Diagnosis not present

## 2017-02-04 DIAGNOSIS — R1310 Dysphagia, unspecified: Secondary | ICD-10-CM | POA: Diagnosis not present

## 2017-02-05 DIAGNOSIS — F339 Major depressive disorder, recurrent, unspecified: Secondary | ICD-10-CM | POA: Diagnosis not present

## 2017-02-05 DIAGNOSIS — G309 Alzheimer's disease, unspecified: Secondary | ICD-10-CM | POA: Diagnosis not present

## 2017-02-05 DIAGNOSIS — H04129 Dry eye syndrome of unspecified lacrimal gland: Secondary | ICD-10-CM | POA: Diagnosis not present

## 2017-02-05 DIAGNOSIS — I672 Cerebral atherosclerosis: Secondary | ICD-10-CM | POA: Diagnosis not present

## 2017-02-05 DIAGNOSIS — I679 Cerebrovascular disease, unspecified: Secondary | ICD-10-CM | POA: Diagnosis not present

## 2017-02-05 DIAGNOSIS — R1310 Dysphagia, unspecified: Secondary | ICD-10-CM | POA: Diagnosis not present

## 2017-02-06 DIAGNOSIS — F419 Anxiety disorder, unspecified: Secondary | ICD-10-CM | POA: Diagnosis not present

## 2017-02-06 DIAGNOSIS — H04129 Dry eye syndrome of unspecified lacrimal gland: Secondary | ICD-10-CM | POA: Diagnosis not present

## 2017-02-06 DIAGNOSIS — I672 Cerebral atherosclerosis: Secondary | ICD-10-CM | POA: Diagnosis not present

## 2017-02-06 DIAGNOSIS — R1310 Dysphagia, unspecified: Secondary | ICD-10-CM | POA: Diagnosis not present

## 2017-02-06 DIAGNOSIS — I679 Cerebrovascular disease, unspecified: Secondary | ICD-10-CM | POA: Diagnosis not present

## 2017-02-06 DIAGNOSIS — G309 Alzheimer's disease, unspecified: Secondary | ICD-10-CM | POA: Diagnosis not present

## 2017-02-06 DIAGNOSIS — F339 Major depressive disorder, recurrent, unspecified: Secondary | ICD-10-CM | POA: Diagnosis not present

## 2017-02-06 DIAGNOSIS — I10 Essential (primary) hypertension: Secondary | ICD-10-CM | POA: Diagnosis not present

## 2017-02-08 DIAGNOSIS — G309 Alzheimer's disease, unspecified: Secondary | ICD-10-CM | POA: Diagnosis not present

## 2017-02-08 DIAGNOSIS — R1310 Dysphagia, unspecified: Secondary | ICD-10-CM | POA: Diagnosis not present

## 2017-02-08 DIAGNOSIS — H04129 Dry eye syndrome of unspecified lacrimal gland: Secondary | ICD-10-CM | POA: Diagnosis not present

## 2017-02-08 DIAGNOSIS — F339 Major depressive disorder, recurrent, unspecified: Secondary | ICD-10-CM | POA: Diagnosis not present

## 2017-02-08 DIAGNOSIS — I672 Cerebral atherosclerosis: Secondary | ICD-10-CM | POA: Diagnosis not present

## 2017-02-08 DIAGNOSIS — I679 Cerebrovascular disease, unspecified: Secondary | ICD-10-CM | POA: Diagnosis not present

## 2017-02-09 DIAGNOSIS — F0391 Unspecified dementia with behavioral disturbance: Secondary | ICD-10-CM | POA: Diagnosis not present

## 2017-02-09 DIAGNOSIS — F5101 Primary insomnia: Secondary | ICD-10-CM | POA: Diagnosis not present

## 2017-02-09 DIAGNOSIS — F331 Major depressive disorder, recurrent, moderate: Secondary | ICD-10-CM | POA: Diagnosis not present

## 2017-02-09 DIAGNOSIS — F419 Anxiety disorder, unspecified: Secondary | ICD-10-CM | POA: Diagnosis not present

## 2017-02-11 DIAGNOSIS — G309 Alzheimer's disease, unspecified: Secondary | ICD-10-CM | POA: Diagnosis not present

## 2017-02-11 DIAGNOSIS — F339 Major depressive disorder, recurrent, unspecified: Secondary | ICD-10-CM | POA: Diagnosis not present

## 2017-02-11 DIAGNOSIS — I679 Cerebrovascular disease, unspecified: Secondary | ICD-10-CM | POA: Diagnosis not present

## 2017-02-11 DIAGNOSIS — H04129 Dry eye syndrome of unspecified lacrimal gland: Secondary | ICD-10-CM | POA: Diagnosis not present

## 2017-02-11 DIAGNOSIS — R1310 Dysphagia, unspecified: Secondary | ICD-10-CM | POA: Diagnosis not present

## 2017-02-11 DIAGNOSIS — I672 Cerebral atherosclerosis: Secondary | ICD-10-CM | POA: Diagnosis not present

## 2017-02-12 DIAGNOSIS — I679 Cerebrovascular disease, unspecified: Secondary | ICD-10-CM | POA: Diagnosis not present

## 2017-02-12 DIAGNOSIS — G309 Alzheimer's disease, unspecified: Secondary | ICD-10-CM | POA: Diagnosis not present

## 2017-02-12 DIAGNOSIS — H04129 Dry eye syndrome of unspecified lacrimal gland: Secondary | ICD-10-CM | POA: Diagnosis not present

## 2017-02-12 DIAGNOSIS — R1310 Dysphagia, unspecified: Secondary | ICD-10-CM | POA: Diagnosis not present

## 2017-02-12 DIAGNOSIS — I672 Cerebral atherosclerosis: Secondary | ICD-10-CM | POA: Diagnosis not present

## 2017-02-12 DIAGNOSIS — F339 Major depressive disorder, recurrent, unspecified: Secondary | ICD-10-CM | POA: Diagnosis not present

## 2017-02-13 DIAGNOSIS — R32 Unspecified urinary incontinence: Secondary | ICD-10-CM | POA: Diagnosis not present

## 2017-02-13 DIAGNOSIS — R634 Abnormal weight loss: Secondary | ICD-10-CM | POA: Diagnosis not present

## 2017-02-13 DIAGNOSIS — I10 Essential (primary) hypertension: Secondary | ICD-10-CM | POA: Diagnosis not present

## 2017-02-15 DIAGNOSIS — G309 Alzheimer's disease, unspecified: Secondary | ICD-10-CM | POA: Diagnosis not present

## 2017-02-15 DIAGNOSIS — I679 Cerebrovascular disease, unspecified: Secondary | ICD-10-CM | POA: Diagnosis not present

## 2017-02-15 DIAGNOSIS — I672 Cerebral atherosclerosis: Secondary | ICD-10-CM | POA: Diagnosis not present

## 2017-02-15 DIAGNOSIS — H04129 Dry eye syndrome of unspecified lacrimal gland: Secondary | ICD-10-CM | POA: Diagnosis not present

## 2017-02-15 DIAGNOSIS — F339 Major depressive disorder, recurrent, unspecified: Secondary | ICD-10-CM | POA: Diagnosis not present

## 2017-02-15 DIAGNOSIS — R1310 Dysphagia, unspecified: Secondary | ICD-10-CM | POA: Diagnosis not present

## 2017-02-18 DIAGNOSIS — G309 Alzheimer's disease, unspecified: Secondary | ICD-10-CM | POA: Diagnosis not present

## 2017-02-18 DIAGNOSIS — I679 Cerebrovascular disease, unspecified: Secondary | ICD-10-CM | POA: Diagnosis not present

## 2017-02-18 DIAGNOSIS — R1310 Dysphagia, unspecified: Secondary | ICD-10-CM | POA: Diagnosis not present

## 2017-02-18 DIAGNOSIS — F339 Major depressive disorder, recurrent, unspecified: Secondary | ICD-10-CM | POA: Diagnosis not present

## 2017-02-18 DIAGNOSIS — H04129 Dry eye syndrome of unspecified lacrimal gland: Secondary | ICD-10-CM | POA: Diagnosis not present

## 2017-02-18 DIAGNOSIS — I672 Cerebral atherosclerosis: Secondary | ICD-10-CM | POA: Diagnosis not present

## 2017-02-19 DIAGNOSIS — G309 Alzheimer's disease, unspecified: Secondary | ICD-10-CM | POA: Diagnosis not present

## 2017-02-19 DIAGNOSIS — F339 Major depressive disorder, recurrent, unspecified: Secondary | ICD-10-CM | POA: Diagnosis not present

## 2017-02-19 DIAGNOSIS — H04129 Dry eye syndrome of unspecified lacrimal gland: Secondary | ICD-10-CM | POA: Diagnosis not present

## 2017-02-19 DIAGNOSIS — R1310 Dysphagia, unspecified: Secondary | ICD-10-CM | POA: Diagnosis not present

## 2017-02-19 DIAGNOSIS — I672 Cerebral atherosclerosis: Secondary | ICD-10-CM | POA: Diagnosis not present

## 2017-02-19 DIAGNOSIS — I679 Cerebrovascular disease, unspecified: Secondary | ICD-10-CM | POA: Diagnosis not present

## 2017-02-20 DIAGNOSIS — R1310 Dysphagia, unspecified: Secondary | ICD-10-CM | POA: Diagnosis not present

## 2017-02-20 DIAGNOSIS — I672 Cerebral atherosclerosis: Secondary | ICD-10-CM | POA: Diagnosis not present

## 2017-02-20 DIAGNOSIS — H04129 Dry eye syndrome of unspecified lacrimal gland: Secondary | ICD-10-CM | POA: Diagnosis not present

## 2017-02-20 DIAGNOSIS — F339 Major depressive disorder, recurrent, unspecified: Secondary | ICD-10-CM | POA: Diagnosis not present

## 2017-02-20 DIAGNOSIS — G309 Alzheimer's disease, unspecified: Secondary | ICD-10-CM | POA: Diagnosis not present

## 2017-02-20 DIAGNOSIS — I679 Cerebrovascular disease, unspecified: Secondary | ICD-10-CM | POA: Diagnosis not present

## 2017-02-21 DIAGNOSIS — I679 Cerebrovascular disease, unspecified: Secondary | ICD-10-CM | POA: Diagnosis not present

## 2017-02-21 DIAGNOSIS — F339 Major depressive disorder, recurrent, unspecified: Secondary | ICD-10-CM | POA: Diagnosis not present

## 2017-02-21 DIAGNOSIS — I672 Cerebral atherosclerosis: Secondary | ICD-10-CM | POA: Diagnosis not present

## 2017-02-21 DIAGNOSIS — G309 Alzheimer's disease, unspecified: Secondary | ICD-10-CM | POA: Diagnosis not present

## 2017-02-21 DIAGNOSIS — H04129 Dry eye syndrome of unspecified lacrimal gland: Secondary | ICD-10-CM | POA: Diagnosis not present

## 2017-02-21 DIAGNOSIS — R1310 Dysphagia, unspecified: Secondary | ICD-10-CM | POA: Diagnosis not present

## 2017-02-22 DIAGNOSIS — I672 Cerebral atherosclerosis: Secondary | ICD-10-CM | POA: Diagnosis not present

## 2017-02-22 DIAGNOSIS — G309 Alzheimer's disease, unspecified: Secondary | ICD-10-CM | POA: Diagnosis not present

## 2017-02-22 DIAGNOSIS — I679 Cerebrovascular disease, unspecified: Secondary | ICD-10-CM | POA: Diagnosis not present

## 2017-02-22 DIAGNOSIS — R1310 Dysphagia, unspecified: Secondary | ICD-10-CM | POA: Diagnosis not present

## 2017-02-22 DIAGNOSIS — F339 Major depressive disorder, recurrent, unspecified: Secondary | ICD-10-CM | POA: Diagnosis not present

## 2017-02-22 DIAGNOSIS — H04129 Dry eye syndrome of unspecified lacrimal gland: Secondary | ICD-10-CM | POA: Diagnosis not present

## 2017-02-25 DIAGNOSIS — I679 Cerebrovascular disease, unspecified: Secondary | ICD-10-CM | POA: Diagnosis not present

## 2017-02-25 DIAGNOSIS — F339 Major depressive disorder, recurrent, unspecified: Secondary | ICD-10-CM | POA: Diagnosis not present

## 2017-02-25 DIAGNOSIS — I672 Cerebral atherosclerosis: Secondary | ICD-10-CM | POA: Diagnosis not present

## 2017-02-25 DIAGNOSIS — H04129 Dry eye syndrome of unspecified lacrimal gland: Secondary | ICD-10-CM | POA: Diagnosis not present

## 2017-02-25 DIAGNOSIS — R1310 Dysphagia, unspecified: Secondary | ICD-10-CM | POA: Diagnosis not present

## 2017-02-25 DIAGNOSIS — G309 Alzheimer's disease, unspecified: Secondary | ICD-10-CM | POA: Diagnosis not present

## 2017-02-27 DIAGNOSIS — I672 Cerebral atherosclerosis: Secondary | ICD-10-CM | POA: Diagnosis not present

## 2017-02-27 DIAGNOSIS — F339 Major depressive disorder, recurrent, unspecified: Secondary | ICD-10-CM | POA: Diagnosis not present

## 2017-02-27 DIAGNOSIS — H04129 Dry eye syndrome of unspecified lacrimal gland: Secondary | ICD-10-CM | POA: Diagnosis not present

## 2017-02-27 DIAGNOSIS — I679 Cerebrovascular disease, unspecified: Secondary | ICD-10-CM | POA: Diagnosis not present

## 2017-02-27 DIAGNOSIS — R1310 Dysphagia, unspecified: Secondary | ICD-10-CM | POA: Diagnosis not present

## 2017-02-27 DIAGNOSIS — G309 Alzheimer's disease, unspecified: Secondary | ICD-10-CM | POA: Diagnosis not present

## 2017-02-28 DIAGNOSIS — I672 Cerebral atherosclerosis: Secondary | ICD-10-CM | POA: Diagnosis not present

## 2017-02-28 DIAGNOSIS — R1310 Dysphagia, unspecified: Secondary | ICD-10-CM | POA: Diagnosis not present

## 2017-02-28 DIAGNOSIS — F339 Major depressive disorder, recurrent, unspecified: Secondary | ICD-10-CM | POA: Diagnosis not present

## 2017-02-28 DIAGNOSIS — I679 Cerebrovascular disease, unspecified: Secondary | ICD-10-CM | POA: Diagnosis not present

## 2017-02-28 DIAGNOSIS — H04129 Dry eye syndrome of unspecified lacrimal gland: Secondary | ICD-10-CM | POA: Diagnosis not present

## 2017-02-28 DIAGNOSIS — G309 Alzheimer's disease, unspecified: Secondary | ICD-10-CM | POA: Diagnosis not present

## 2017-03-01 DIAGNOSIS — I679 Cerebrovascular disease, unspecified: Secondary | ICD-10-CM | POA: Diagnosis not present

## 2017-03-01 DIAGNOSIS — H04129 Dry eye syndrome of unspecified lacrimal gland: Secondary | ICD-10-CM | POA: Diagnosis not present

## 2017-03-01 DIAGNOSIS — R1310 Dysphagia, unspecified: Secondary | ICD-10-CM | POA: Diagnosis not present

## 2017-03-01 DIAGNOSIS — I672 Cerebral atherosclerosis: Secondary | ICD-10-CM | POA: Diagnosis not present

## 2017-03-01 DIAGNOSIS — F339 Major depressive disorder, recurrent, unspecified: Secondary | ICD-10-CM | POA: Diagnosis not present

## 2017-03-01 DIAGNOSIS — G309 Alzheimer's disease, unspecified: Secondary | ICD-10-CM | POA: Diagnosis not present

## 2017-03-04 DIAGNOSIS — H04129 Dry eye syndrome of unspecified lacrimal gland: Secondary | ICD-10-CM | POA: Diagnosis not present

## 2017-03-04 DIAGNOSIS — I672 Cerebral atherosclerosis: Secondary | ICD-10-CM | POA: Diagnosis not present

## 2017-03-04 DIAGNOSIS — R1310 Dysphagia, unspecified: Secondary | ICD-10-CM | POA: Diagnosis not present

## 2017-03-04 DIAGNOSIS — G309 Alzheimer's disease, unspecified: Secondary | ICD-10-CM | POA: Diagnosis not present

## 2017-03-04 DIAGNOSIS — I679 Cerebrovascular disease, unspecified: Secondary | ICD-10-CM | POA: Diagnosis not present

## 2017-03-04 DIAGNOSIS — F339 Major depressive disorder, recurrent, unspecified: Secondary | ICD-10-CM | POA: Diagnosis not present

## 2017-03-06 DIAGNOSIS — F0390 Unspecified dementia without behavioral disturbance: Secondary | ICD-10-CM | POA: Diagnosis not present

## 2017-03-06 DIAGNOSIS — R296 Repeated falls: Secondary | ICD-10-CM | POA: Diagnosis not present

## 2017-03-06 DIAGNOSIS — I10 Essential (primary) hypertension: Secondary | ICD-10-CM | POA: Diagnosis not present

## 2017-03-07 DIAGNOSIS — R1310 Dysphagia, unspecified: Secondary | ICD-10-CM | POA: Diagnosis not present

## 2017-03-07 DIAGNOSIS — G309 Alzheimer's disease, unspecified: Secondary | ICD-10-CM | POA: Diagnosis not present

## 2017-03-07 DIAGNOSIS — H04129 Dry eye syndrome of unspecified lacrimal gland: Secondary | ICD-10-CM | POA: Diagnosis not present

## 2017-03-07 DIAGNOSIS — F339 Major depressive disorder, recurrent, unspecified: Secondary | ICD-10-CM | POA: Diagnosis not present

## 2017-03-07 DIAGNOSIS — I672 Cerebral atherosclerosis: Secondary | ICD-10-CM | POA: Diagnosis not present

## 2017-03-07 DIAGNOSIS — I679 Cerebrovascular disease, unspecified: Secondary | ICD-10-CM | POA: Diagnosis not present

## 2017-03-08 DIAGNOSIS — G309 Alzheimer's disease, unspecified: Secondary | ICD-10-CM | POA: Diagnosis not present

## 2017-03-08 DIAGNOSIS — I672 Cerebral atherosclerosis: Secondary | ICD-10-CM | POA: Diagnosis not present

## 2017-03-08 DIAGNOSIS — F339 Major depressive disorder, recurrent, unspecified: Secondary | ICD-10-CM | POA: Diagnosis not present

## 2017-03-08 DIAGNOSIS — I679 Cerebrovascular disease, unspecified: Secondary | ICD-10-CM | POA: Diagnosis not present

## 2017-03-08 DIAGNOSIS — H04129 Dry eye syndrome of unspecified lacrimal gland: Secondary | ICD-10-CM | POA: Diagnosis not present

## 2017-03-08 DIAGNOSIS — R1310 Dysphagia, unspecified: Secondary | ICD-10-CM | POA: Diagnosis not present

## 2017-03-11 DIAGNOSIS — F339 Major depressive disorder, recurrent, unspecified: Secondary | ICD-10-CM | POA: Diagnosis not present

## 2017-03-11 DIAGNOSIS — I679 Cerebrovascular disease, unspecified: Secondary | ICD-10-CM | POA: Diagnosis not present

## 2017-03-11 DIAGNOSIS — F419 Anxiety disorder, unspecified: Secondary | ICD-10-CM | POA: Diagnosis not present

## 2017-03-11 DIAGNOSIS — R1310 Dysphagia, unspecified: Secondary | ICD-10-CM | POA: Diagnosis not present

## 2017-03-11 DIAGNOSIS — H04129 Dry eye syndrome of unspecified lacrimal gland: Secondary | ICD-10-CM | POA: Diagnosis not present

## 2017-03-11 DIAGNOSIS — G309 Alzheimer's disease, unspecified: Secondary | ICD-10-CM | POA: Diagnosis not present

## 2017-03-11 DIAGNOSIS — I672 Cerebral atherosclerosis: Secondary | ICD-10-CM | POA: Diagnosis not present

## 2017-03-12 DIAGNOSIS — R1310 Dysphagia, unspecified: Secondary | ICD-10-CM | POA: Diagnosis not present

## 2017-03-12 DIAGNOSIS — I672 Cerebral atherosclerosis: Secondary | ICD-10-CM | POA: Diagnosis not present

## 2017-03-12 DIAGNOSIS — H04129 Dry eye syndrome of unspecified lacrimal gland: Secondary | ICD-10-CM | POA: Diagnosis not present

## 2017-03-12 DIAGNOSIS — I679 Cerebrovascular disease, unspecified: Secondary | ICD-10-CM | POA: Diagnosis not present

## 2017-03-12 DIAGNOSIS — F339 Major depressive disorder, recurrent, unspecified: Secondary | ICD-10-CM | POA: Diagnosis not present

## 2017-03-12 DIAGNOSIS — G309 Alzheimer's disease, unspecified: Secondary | ICD-10-CM | POA: Diagnosis not present

## 2017-03-14 DIAGNOSIS — I679 Cerebrovascular disease, unspecified: Secondary | ICD-10-CM | POA: Diagnosis not present

## 2017-03-14 DIAGNOSIS — I672 Cerebral atherosclerosis: Secondary | ICD-10-CM | POA: Diagnosis not present

## 2017-03-14 DIAGNOSIS — R1310 Dysphagia, unspecified: Secondary | ICD-10-CM | POA: Diagnosis not present

## 2017-03-14 DIAGNOSIS — H04129 Dry eye syndrome of unspecified lacrimal gland: Secondary | ICD-10-CM | POA: Diagnosis not present

## 2017-03-14 DIAGNOSIS — G309 Alzheimer's disease, unspecified: Secondary | ICD-10-CM | POA: Diagnosis not present

## 2017-03-14 DIAGNOSIS — F339 Major depressive disorder, recurrent, unspecified: Secondary | ICD-10-CM | POA: Diagnosis not present

## 2017-03-15 DIAGNOSIS — H04129 Dry eye syndrome of unspecified lacrimal gland: Secondary | ICD-10-CM | POA: Diagnosis not present

## 2017-03-15 DIAGNOSIS — F339 Major depressive disorder, recurrent, unspecified: Secondary | ICD-10-CM | POA: Diagnosis not present

## 2017-03-15 DIAGNOSIS — R1310 Dysphagia, unspecified: Secondary | ICD-10-CM | POA: Diagnosis not present

## 2017-03-15 DIAGNOSIS — I679 Cerebrovascular disease, unspecified: Secondary | ICD-10-CM | POA: Diagnosis not present

## 2017-03-15 DIAGNOSIS — G309 Alzheimer's disease, unspecified: Secondary | ICD-10-CM | POA: Diagnosis not present

## 2017-03-15 DIAGNOSIS — I672 Cerebral atherosclerosis: Secondary | ICD-10-CM | POA: Diagnosis not present

## 2017-03-16 DIAGNOSIS — I672 Cerebral atherosclerosis: Secondary | ICD-10-CM | POA: Diagnosis not present

## 2017-03-16 DIAGNOSIS — R1310 Dysphagia, unspecified: Secondary | ICD-10-CM | POA: Diagnosis not present

## 2017-03-16 DIAGNOSIS — H04129 Dry eye syndrome of unspecified lacrimal gland: Secondary | ICD-10-CM | POA: Diagnosis not present

## 2017-03-16 DIAGNOSIS — G309 Alzheimer's disease, unspecified: Secondary | ICD-10-CM | POA: Diagnosis not present

## 2017-03-16 DIAGNOSIS — F339 Major depressive disorder, recurrent, unspecified: Secondary | ICD-10-CM | POA: Diagnosis not present

## 2017-03-16 DIAGNOSIS — I679 Cerebrovascular disease, unspecified: Secondary | ICD-10-CM | POA: Diagnosis not present

## 2017-03-18 DIAGNOSIS — R1310 Dysphagia, unspecified: Secondary | ICD-10-CM | POA: Diagnosis not present

## 2017-03-18 DIAGNOSIS — G309 Alzheimer's disease, unspecified: Secondary | ICD-10-CM | POA: Diagnosis not present

## 2017-03-18 DIAGNOSIS — F339 Major depressive disorder, recurrent, unspecified: Secondary | ICD-10-CM | POA: Diagnosis not present

## 2017-03-18 DIAGNOSIS — I672 Cerebral atherosclerosis: Secondary | ICD-10-CM | POA: Diagnosis not present

## 2017-03-18 DIAGNOSIS — H04129 Dry eye syndrome of unspecified lacrimal gland: Secondary | ICD-10-CM | POA: Diagnosis not present

## 2017-03-18 DIAGNOSIS — I679 Cerebrovascular disease, unspecified: Secondary | ICD-10-CM | POA: Diagnosis not present

## 2017-03-20 DIAGNOSIS — H04129 Dry eye syndrome of unspecified lacrimal gland: Secondary | ICD-10-CM | POA: Diagnosis not present

## 2017-03-20 DIAGNOSIS — F331 Major depressive disorder, recurrent, moderate: Secondary | ICD-10-CM | POA: Diagnosis not present

## 2017-03-20 DIAGNOSIS — I672 Cerebral atherosclerosis: Secondary | ICD-10-CM | POA: Diagnosis not present

## 2017-03-20 DIAGNOSIS — G309 Alzheimer's disease, unspecified: Secondary | ICD-10-CM | POA: Diagnosis not present

## 2017-03-20 DIAGNOSIS — I679 Cerebrovascular disease, unspecified: Secondary | ICD-10-CM | POA: Diagnosis not present

## 2017-03-20 DIAGNOSIS — F5101 Primary insomnia: Secondary | ICD-10-CM | POA: Diagnosis not present

## 2017-03-20 DIAGNOSIS — R1310 Dysphagia, unspecified: Secondary | ICD-10-CM | POA: Diagnosis not present

## 2017-03-20 DIAGNOSIS — F339 Major depressive disorder, recurrent, unspecified: Secondary | ICD-10-CM | POA: Diagnosis not present

## 2017-03-20 DIAGNOSIS — F419 Anxiety disorder, unspecified: Secondary | ICD-10-CM | POA: Diagnosis not present

## 2017-03-20 DIAGNOSIS — F0391 Unspecified dementia with behavioral disturbance: Secondary | ICD-10-CM | POA: Diagnosis not present

## 2017-03-22 DIAGNOSIS — R1310 Dysphagia, unspecified: Secondary | ICD-10-CM | POA: Diagnosis not present

## 2017-03-22 DIAGNOSIS — I672 Cerebral atherosclerosis: Secondary | ICD-10-CM | POA: Diagnosis not present

## 2017-03-22 DIAGNOSIS — H04129 Dry eye syndrome of unspecified lacrimal gland: Secondary | ICD-10-CM | POA: Diagnosis not present

## 2017-03-22 DIAGNOSIS — F339 Major depressive disorder, recurrent, unspecified: Secondary | ICD-10-CM | POA: Diagnosis not present

## 2017-03-22 DIAGNOSIS — I679 Cerebrovascular disease, unspecified: Secondary | ICD-10-CM | POA: Diagnosis not present

## 2017-03-22 DIAGNOSIS — G309 Alzheimer's disease, unspecified: Secondary | ICD-10-CM | POA: Diagnosis not present

## 2017-03-25 DIAGNOSIS — I679 Cerebrovascular disease, unspecified: Secondary | ICD-10-CM | POA: Diagnosis not present

## 2017-03-25 DIAGNOSIS — H04129 Dry eye syndrome of unspecified lacrimal gland: Secondary | ICD-10-CM | POA: Diagnosis not present

## 2017-03-25 DIAGNOSIS — I672 Cerebral atherosclerosis: Secondary | ICD-10-CM | POA: Diagnosis not present

## 2017-03-25 DIAGNOSIS — F339 Major depressive disorder, recurrent, unspecified: Secondary | ICD-10-CM | POA: Diagnosis not present

## 2017-03-25 DIAGNOSIS — R1310 Dysphagia, unspecified: Secondary | ICD-10-CM | POA: Diagnosis not present

## 2017-03-25 DIAGNOSIS — G309 Alzheimer's disease, unspecified: Secondary | ICD-10-CM | POA: Diagnosis not present

## 2017-03-27 DIAGNOSIS — G309 Alzheimer's disease, unspecified: Secondary | ICD-10-CM | POA: Diagnosis not present

## 2017-03-27 DIAGNOSIS — H04129 Dry eye syndrome of unspecified lacrimal gland: Secondary | ICD-10-CM | POA: Diagnosis not present

## 2017-03-27 DIAGNOSIS — F339 Major depressive disorder, recurrent, unspecified: Secondary | ICD-10-CM | POA: Diagnosis not present

## 2017-03-27 DIAGNOSIS — I672 Cerebral atherosclerosis: Secondary | ICD-10-CM | POA: Diagnosis not present

## 2017-03-27 DIAGNOSIS — R1310 Dysphagia, unspecified: Secondary | ICD-10-CM | POA: Diagnosis not present

## 2017-03-27 DIAGNOSIS — I679 Cerebrovascular disease, unspecified: Secondary | ICD-10-CM | POA: Diagnosis not present

## 2017-03-28 DIAGNOSIS — I672 Cerebral atherosclerosis: Secondary | ICD-10-CM | POA: Diagnosis not present

## 2017-03-28 DIAGNOSIS — R1310 Dysphagia, unspecified: Secondary | ICD-10-CM | POA: Diagnosis not present

## 2017-03-28 DIAGNOSIS — F339 Major depressive disorder, recurrent, unspecified: Secondary | ICD-10-CM | POA: Diagnosis not present

## 2017-03-28 DIAGNOSIS — H04129 Dry eye syndrome of unspecified lacrimal gland: Secondary | ICD-10-CM | POA: Diagnosis not present

## 2017-03-28 DIAGNOSIS — G309 Alzheimer's disease, unspecified: Secondary | ICD-10-CM | POA: Diagnosis not present

## 2017-03-28 DIAGNOSIS — I679 Cerebrovascular disease, unspecified: Secondary | ICD-10-CM | POA: Diagnosis not present

## 2017-04-02 DIAGNOSIS — I679 Cerebrovascular disease, unspecified: Secondary | ICD-10-CM | POA: Diagnosis not present

## 2017-04-02 DIAGNOSIS — H04129 Dry eye syndrome of unspecified lacrimal gland: Secondary | ICD-10-CM | POA: Diagnosis not present

## 2017-04-02 DIAGNOSIS — G309 Alzheimer's disease, unspecified: Secondary | ICD-10-CM | POA: Diagnosis not present

## 2017-04-02 DIAGNOSIS — F339 Major depressive disorder, recurrent, unspecified: Secondary | ICD-10-CM | POA: Diagnosis not present

## 2017-04-02 DIAGNOSIS — I672 Cerebral atherosclerosis: Secondary | ICD-10-CM | POA: Diagnosis not present

## 2017-04-02 DIAGNOSIS — R1310 Dysphagia, unspecified: Secondary | ICD-10-CM | POA: Diagnosis not present

## 2017-04-03 DIAGNOSIS — I679 Cerebrovascular disease, unspecified: Secondary | ICD-10-CM | POA: Diagnosis not present

## 2017-04-03 DIAGNOSIS — I672 Cerebral atherosclerosis: Secondary | ICD-10-CM | POA: Diagnosis not present

## 2017-04-03 DIAGNOSIS — F339 Major depressive disorder, recurrent, unspecified: Secondary | ICD-10-CM | POA: Diagnosis not present

## 2017-04-03 DIAGNOSIS — F5101 Primary insomnia: Secondary | ICD-10-CM | POA: Diagnosis not present

## 2017-04-03 DIAGNOSIS — F331 Major depressive disorder, recurrent, moderate: Secondary | ICD-10-CM | POA: Diagnosis not present

## 2017-04-03 DIAGNOSIS — R1310 Dysphagia, unspecified: Secondary | ICD-10-CM | POA: Diagnosis not present

## 2017-04-03 DIAGNOSIS — H04129 Dry eye syndrome of unspecified lacrimal gland: Secondary | ICD-10-CM | POA: Diagnosis not present

## 2017-04-03 DIAGNOSIS — R634 Abnormal weight loss: Secondary | ICD-10-CM | POA: Diagnosis not present

## 2017-04-03 DIAGNOSIS — G309 Alzheimer's disease, unspecified: Secondary | ICD-10-CM | POA: Diagnosis not present

## 2017-04-03 DIAGNOSIS — R32 Unspecified urinary incontinence: Secondary | ICD-10-CM | POA: Diagnosis not present

## 2017-04-03 DIAGNOSIS — F0391 Unspecified dementia with behavioral disturbance: Secondary | ICD-10-CM | POA: Diagnosis not present

## 2017-04-03 DIAGNOSIS — F419 Anxiety disorder, unspecified: Secondary | ICD-10-CM | POA: Diagnosis not present

## 2017-04-03 DIAGNOSIS — M8580 Other specified disorders of bone density and structure, unspecified site: Secondary | ICD-10-CM | POA: Diagnosis not present

## 2017-04-04 DIAGNOSIS — G309 Alzheimer's disease, unspecified: Secondary | ICD-10-CM | POA: Diagnosis not present

## 2017-04-04 DIAGNOSIS — I679 Cerebrovascular disease, unspecified: Secondary | ICD-10-CM | POA: Diagnosis not present

## 2017-04-04 DIAGNOSIS — F339 Major depressive disorder, recurrent, unspecified: Secondary | ICD-10-CM | POA: Diagnosis not present

## 2017-04-04 DIAGNOSIS — H04129 Dry eye syndrome of unspecified lacrimal gland: Secondary | ICD-10-CM | POA: Diagnosis not present

## 2017-04-04 DIAGNOSIS — I672 Cerebral atherosclerosis: Secondary | ICD-10-CM | POA: Diagnosis not present

## 2017-04-04 DIAGNOSIS — R1310 Dysphagia, unspecified: Secondary | ICD-10-CM | POA: Diagnosis not present

## 2017-04-05 DIAGNOSIS — R1310 Dysphagia, unspecified: Secondary | ICD-10-CM | POA: Diagnosis not present

## 2017-04-05 DIAGNOSIS — F339 Major depressive disorder, recurrent, unspecified: Secondary | ICD-10-CM | POA: Diagnosis not present

## 2017-04-05 DIAGNOSIS — G309 Alzheimer's disease, unspecified: Secondary | ICD-10-CM | POA: Diagnosis not present

## 2017-04-05 DIAGNOSIS — I679 Cerebrovascular disease, unspecified: Secondary | ICD-10-CM | POA: Diagnosis not present

## 2017-04-05 DIAGNOSIS — I672 Cerebral atherosclerosis: Secondary | ICD-10-CM | POA: Diagnosis not present

## 2017-04-05 DIAGNOSIS — H04129 Dry eye syndrome of unspecified lacrimal gland: Secondary | ICD-10-CM | POA: Diagnosis not present

## 2017-04-08 DIAGNOSIS — R1310 Dysphagia, unspecified: Secondary | ICD-10-CM | POA: Diagnosis not present

## 2017-04-08 DIAGNOSIS — G309 Alzheimer's disease, unspecified: Secondary | ICD-10-CM | POA: Diagnosis not present

## 2017-04-08 DIAGNOSIS — H04129 Dry eye syndrome of unspecified lacrimal gland: Secondary | ICD-10-CM | POA: Diagnosis not present

## 2017-04-08 DIAGNOSIS — F339 Major depressive disorder, recurrent, unspecified: Secondary | ICD-10-CM | POA: Diagnosis not present

## 2017-04-08 DIAGNOSIS — I672 Cerebral atherosclerosis: Secondary | ICD-10-CM | POA: Diagnosis not present

## 2017-04-08 DIAGNOSIS — I679 Cerebrovascular disease, unspecified: Secondary | ICD-10-CM | POA: Diagnosis not present

## 2017-04-09 DIAGNOSIS — G309 Alzheimer's disease, unspecified: Secondary | ICD-10-CM | POA: Diagnosis not present

## 2017-04-09 DIAGNOSIS — H04129 Dry eye syndrome of unspecified lacrimal gland: Secondary | ICD-10-CM | POA: Diagnosis not present

## 2017-04-09 DIAGNOSIS — R1310 Dysphagia, unspecified: Secondary | ICD-10-CM | POA: Diagnosis not present

## 2017-04-09 DIAGNOSIS — I672 Cerebral atherosclerosis: Secondary | ICD-10-CM | POA: Diagnosis not present

## 2017-04-09 DIAGNOSIS — I679 Cerebrovascular disease, unspecified: Secondary | ICD-10-CM | POA: Diagnosis not present

## 2017-04-09 DIAGNOSIS — F339 Major depressive disorder, recurrent, unspecified: Secondary | ICD-10-CM | POA: Diagnosis not present

## 2017-04-10 DIAGNOSIS — R1310 Dysphagia, unspecified: Secondary | ICD-10-CM | POA: Diagnosis not present

## 2017-04-10 DIAGNOSIS — G309 Alzheimer's disease, unspecified: Secondary | ICD-10-CM | POA: Diagnosis not present

## 2017-04-10 DIAGNOSIS — I679 Cerebrovascular disease, unspecified: Secondary | ICD-10-CM | POA: Diagnosis not present

## 2017-04-10 DIAGNOSIS — F419 Anxiety disorder, unspecified: Secondary | ICD-10-CM | POA: Diagnosis not present

## 2017-04-10 DIAGNOSIS — H04129 Dry eye syndrome of unspecified lacrimal gland: Secondary | ICD-10-CM | POA: Diagnosis not present

## 2017-04-10 DIAGNOSIS — F339 Major depressive disorder, recurrent, unspecified: Secondary | ICD-10-CM | POA: Diagnosis not present

## 2017-04-10 DIAGNOSIS — I672 Cerebral atherosclerosis: Secondary | ICD-10-CM | POA: Diagnosis not present

## 2017-04-10 DIAGNOSIS — I10 Essential (primary) hypertension: Secondary | ICD-10-CM | POA: Diagnosis not present

## 2017-04-10 DIAGNOSIS — K219 Gastro-esophageal reflux disease without esophagitis: Secondary | ICD-10-CM | POA: Diagnosis not present

## 2017-04-11 ENCOUNTER — Emergency Department (HOSPITAL_COMMUNITY)
Admission: EM | Admit: 2017-04-11 | Discharge: 2017-04-11 | Disposition: A | Discharge status: Home / Self Care | Attending: Emergency Medicine | Admitting: Emergency Medicine

## 2017-04-11 ENCOUNTER — Encounter (HOSPITAL_COMMUNITY): Payer: Self-pay

## 2017-04-11 DIAGNOSIS — I672 Cerebral atherosclerosis: Secondary | ICD-10-CM | POA: Diagnosis not present

## 2017-04-11 DIAGNOSIS — Z8673 Personal history of transient ischemic attack (TIA), and cerebral infarction without residual deficits: Secondary | ICD-10-CM | POA: Diagnosis not present

## 2017-04-11 DIAGNOSIS — Z79899 Other long term (current) drug therapy: Secondary | ICD-10-CM | POA: Insufficient documentation

## 2017-04-11 DIAGNOSIS — Y939 Activity, unspecified: Secondary | ICD-10-CM | POA: Insufficient documentation

## 2017-04-11 DIAGNOSIS — Y92129 Unspecified place in nursing home as the place of occurrence of the external cause: Secondary | ICD-10-CM | POA: Insufficient documentation

## 2017-04-11 DIAGNOSIS — S0990XA Unspecified injury of head, initial encounter: Secondary | ICD-10-CM | POA: Diagnosis not present

## 2017-04-11 DIAGNOSIS — R4182 Altered mental status, unspecified: Secondary | ICD-10-CM | POA: Diagnosis not present

## 2017-04-11 DIAGNOSIS — F0280 Dementia in other diseases classified elsewhere without behavioral disturbance: Secondary | ICD-10-CM | POA: Insufficient documentation

## 2017-04-11 DIAGNOSIS — R404 Transient alteration of awareness: Secondary | ICD-10-CM | POA: Diagnosis not present

## 2017-04-11 DIAGNOSIS — I10 Essential (primary) hypertension: Secondary | ICD-10-CM | POA: Diagnosis not present

## 2017-04-11 DIAGNOSIS — R55 Syncope and collapse: Secondary | ICD-10-CM | POA: Diagnosis not present

## 2017-04-11 DIAGNOSIS — G309 Alzheimer's disease, unspecified: Secondary | ICD-10-CM | POA: Diagnosis not present

## 2017-04-11 DIAGNOSIS — G464 Cerebellar stroke syndrome: Secondary | ICD-10-CM | POA: Diagnosis not present

## 2017-04-11 DIAGNOSIS — Y999 Unspecified external cause status: Secondary | ICD-10-CM | POA: Diagnosis not present

## 2017-04-11 DIAGNOSIS — I679 Cerebrovascular disease, unspecified: Secondary | ICD-10-CM | POA: Diagnosis not present

## 2017-04-11 DIAGNOSIS — W2201XA Walked into wall, initial encounter: Secondary | ICD-10-CM | POA: Diagnosis not present

## 2017-04-11 DIAGNOSIS — R1310 Dysphagia, unspecified: Secondary | ICD-10-CM | POA: Diagnosis not present

## 2017-04-11 DIAGNOSIS — F339 Major depressive disorder, recurrent, unspecified: Secondary | ICD-10-CM | POA: Diagnosis not present

## 2017-04-11 DIAGNOSIS — H04129 Dry eye syndrome of unspecified lacrimal gland: Secondary | ICD-10-CM | POA: Diagnosis not present

## 2017-04-11 LAB — CBG MONITORING, ED: Glucose-Capillary: 117 mg/dL — ABNORMAL HIGH (ref 65–99)

## 2017-04-11 NOTE — ED Notes (Signed)
ED Provider at bedside. 

## 2017-04-11 NOTE — ED Notes (Signed)
Report attempted to  [facility], no answer

## 2017-04-11 NOTE — ED Notes (Signed)
RN attempted to call report to [facility] again with voicemail

## 2017-04-11 NOTE — ED Notes (Signed)
Pt unable to sign upon d.c due to no signature pad available. Family verbalized understanding of discharge instructions, no further questiosn

## 2017-04-11 NOTE — ED Triage Notes (Signed)
Pt from [facility] c.o syncopal episode. Pt was walking and staff witness pt syncopl episode, pt fell backwards and hit back of head on the wall, putting hole in wall. Pt at baseline per facility. Hx of dementia. Per facility pt has had changes in her medications due to her dementia that has made her more lethargic than before.  VSS, nad. Pt alert.
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
## 2017-04-14 DIAGNOSIS — H04129 Dry eye syndrome of unspecified lacrimal gland: Secondary | ICD-10-CM | POA: Diagnosis not present

## 2017-04-14 DIAGNOSIS — F339 Major depressive disorder, recurrent, unspecified: Secondary | ICD-10-CM | POA: Diagnosis not present

## 2017-04-14 DIAGNOSIS — I672 Cerebral atherosclerosis: Secondary | ICD-10-CM | POA: Diagnosis not present

## 2017-04-14 DIAGNOSIS — R1310 Dysphagia, unspecified: Secondary | ICD-10-CM | POA: Diagnosis not present

## 2017-04-14 DIAGNOSIS — I679 Cerebrovascular disease, unspecified: Secondary | ICD-10-CM | POA: Diagnosis not present

## 2017-04-14 DIAGNOSIS — G309 Alzheimer's disease, unspecified: Secondary | ICD-10-CM | POA: Diagnosis not present

## 2017-04-15 DIAGNOSIS — G309 Alzheimer's disease, unspecified: Secondary | ICD-10-CM | POA: Diagnosis not present

## 2017-04-15 DIAGNOSIS — I672 Cerebral atherosclerosis: Secondary | ICD-10-CM | POA: Diagnosis not present

## 2017-04-15 DIAGNOSIS — H04129 Dry eye syndrome of unspecified lacrimal gland: Secondary | ICD-10-CM | POA: Diagnosis not present

## 2017-04-15 DIAGNOSIS — I679 Cerebrovascular disease, unspecified: Secondary | ICD-10-CM | POA: Diagnosis not present

## 2017-04-15 DIAGNOSIS — F339 Major depressive disorder, recurrent, unspecified: Secondary | ICD-10-CM | POA: Diagnosis not present

## 2017-04-15 DIAGNOSIS — R1310 Dysphagia, unspecified: Secondary | ICD-10-CM | POA: Diagnosis not present

## 2017-04-18 DIAGNOSIS — F339 Major depressive disorder, recurrent, unspecified: Secondary | ICD-10-CM | POA: Diagnosis not present

## 2017-04-18 DIAGNOSIS — I672 Cerebral atherosclerosis: Secondary | ICD-10-CM | POA: Diagnosis not present

## 2017-04-18 DIAGNOSIS — G309 Alzheimer's disease, unspecified: Secondary | ICD-10-CM | POA: Diagnosis not present

## 2017-04-18 DIAGNOSIS — R1310 Dysphagia, unspecified: Secondary | ICD-10-CM | POA: Diagnosis not present

## 2017-04-18 DIAGNOSIS — I679 Cerebrovascular disease, unspecified: Secondary | ICD-10-CM | POA: Diagnosis not present

## 2017-04-18 DIAGNOSIS — H04129 Dry eye syndrome of unspecified lacrimal gland: Secondary | ICD-10-CM | POA: Diagnosis not present

## 2017-04-22 DIAGNOSIS — I679 Cerebrovascular disease, unspecified: Secondary | ICD-10-CM | POA: Diagnosis not present

## 2017-04-22 DIAGNOSIS — H04129 Dry eye syndrome of unspecified lacrimal gland: Secondary | ICD-10-CM | POA: Diagnosis not present

## 2017-04-22 DIAGNOSIS — F339 Major depressive disorder, recurrent, unspecified: Secondary | ICD-10-CM | POA: Diagnosis not present

## 2017-04-22 DIAGNOSIS — I672 Cerebral atherosclerosis: Secondary | ICD-10-CM | POA: Diagnosis not present

## 2017-04-22 DIAGNOSIS — G309 Alzheimer's disease, unspecified: Secondary | ICD-10-CM | POA: Diagnosis not present

## 2017-04-22 DIAGNOSIS — R1310 Dysphagia, unspecified: Secondary | ICD-10-CM | POA: Diagnosis not present

## 2017-04-24 DIAGNOSIS — I679 Cerebrovascular disease, unspecified: Secondary | ICD-10-CM | POA: Diagnosis not present

## 2017-04-24 DIAGNOSIS — H04129 Dry eye syndrome of unspecified lacrimal gland: Secondary | ICD-10-CM | POA: Diagnosis not present

## 2017-04-24 DIAGNOSIS — R1310 Dysphagia, unspecified: Secondary | ICD-10-CM | POA: Diagnosis not present

## 2017-04-24 DIAGNOSIS — F339 Major depressive disorder, recurrent, unspecified: Secondary | ICD-10-CM | POA: Diagnosis not present

## 2017-04-24 DIAGNOSIS — I672 Cerebral atherosclerosis: Secondary | ICD-10-CM | POA: Diagnosis not present

## 2017-04-24 DIAGNOSIS — G309 Alzheimer's disease, unspecified: Secondary | ICD-10-CM | POA: Diagnosis not present

## 2017-04-25 DIAGNOSIS — F339 Major depressive disorder, recurrent, unspecified: Secondary | ICD-10-CM | POA: Diagnosis not present

## 2017-04-25 DIAGNOSIS — I679 Cerebrovascular disease, unspecified: Secondary | ICD-10-CM | POA: Diagnosis not present

## 2017-04-25 DIAGNOSIS — H04129 Dry eye syndrome of unspecified lacrimal gland: Secondary | ICD-10-CM | POA: Diagnosis not present

## 2017-04-25 DIAGNOSIS — I672 Cerebral atherosclerosis: Secondary | ICD-10-CM | POA: Diagnosis not present

## 2017-04-25 DIAGNOSIS — R1310 Dysphagia, unspecified: Secondary | ICD-10-CM | POA: Diagnosis not present

## 2017-04-25 DIAGNOSIS — G309 Alzheimer's disease, unspecified: Secondary | ICD-10-CM | POA: Diagnosis not present

## 2017-04-29 DIAGNOSIS — R1310 Dysphagia, unspecified: Secondary | ICD-10-CM | POA: Diagnosis not present

## 2017-04-29 DIAGNOSIS — I679 Cerebrovascular disease, unspecified: Secondary | ICD-10-CM | POA: Diagnosis not present

## 2017-04-29 DIAGNOSIS — H04129 Dry eye syndrome of unspecified lacrimal gland: Secondary | ICD-10-CM | POA: Diagnosis not present

## 2017-04-29 DIAGNOSIS — I672 Cerebral atherosclerosis: Secondary | ICD-10-CM | POA: Diagnosis not present

## 2017-04-29 DIAGNOSIS — F339 Major depressive disorder, recurrent, unspecified: Secondary | ICD-10-CM | POA: Diagnosis not present

## 2017-04-29 DIAGNOSIS — G309 Alzheimer's disease, unspecified: Secondary | ICD-10-CM | POA: Diagnosis not present

## 2017-04-30 DIAGNOSIS — L603 Nail dystrophy: Secondary | ICD-10-CM | POA: Diagnosis not present

## 2017-04-30 DIAGNOSIS — B351 Tinea unguium: Secondary | ICD-10-CM | POA: Diagnosis not present

## 2017-04-30 DIAGNOSIS — M79672 Pain in left foot: Secondary | ICD-10-CM | POA: Diagnosis not present

## 2017-04-30 DIAGNOSIS — Q845 Enlarged and hypertrophic nails: Secondary | ICD-10-CM | POA: Diagnosis not present

## 2017-04-30 DIAGNOSIS — M79671 Pain in right foot: Secondary | ICD-10-CM | POA: Diagnosis not present

## 2017-04-30 DIAGNOSIS — M2010 Hallux valgus (acquired), unspecified foot: Secondary | ICD-10-CM | POA: Diagnosis not present

## 2017-05-01 DIAGNOSIS — I672 Cerebral atherosclerosis: Secondary | ICD-10-CM | POA: Diagnosis not present

## 2017-05-01 DIAGNOSIS — R1310 Dysphagia, unspecified: Secondary | ICD-10-CM | POA: Diagnosis not present

## 2017-05-01 DIAGNOSIS — I679 Cerebrovascular disease, unspecified: Secondary | ICD-10-CM | POA: Diagnosis not present

## 2017-05-01 DIAGNOSIS — H04129 Dry eye syndrome of unspecified lacrimal gland: Secondary | ICD-10-CM | POA: Diagnosis not present

## 2017-05-01 DIAGNOSIS — H04123 Dry eye syndrome of bilateral lacrimal glands: Secondary | ICD-10-CM | POA: Diagnosis not present

## 2017-05-01 DIAGNOSIS — F0390 Unspecified dementia without behavioral disturbance: Secondary | ICD-10-CM | POA: Diagnosis not present

## 2017-05-01 DIAGNOSIS — F339 Major depressive disorder, recurrent, unspecified: Secondary | ICD-10-CM | POA: Diagnosis not present

## 2017-05-01 DIAGNOSIS — G4700 Insomnia, unspecified: Secondary | ICD-10-CM | POA: Diagnosis not present

## 2017-05-01 DIAGNOSIS — G309 Alzheimer's disease, unspecified: Secondary | ICD-10-CM | POA: Diagnosis not present

## 2017-05-02 DIAGNOSIS — I672 Cerebral atherosclerosis: Secondary | ICD-10-CM | POA: Diagnosis not present

## 2017-05-02 DIAGNOSIS — F339 Major depressive disorder, recurrent, unspecified: Secondary | ICD-10-CM | POA: Diagnosis not present

## 2017-05-02 DIAGNOSIS — I679 Cerebrovascular disease, unspecified: Secondary | ICD-10-CM | POA: Diagnosis not present

## 2017-05-02 DIAGNOSIS — H04129 Dry eye syndrome of unspecified lacrimal gland: Secondary | ICD-10-CM | POA: Diagnosis not present

## 2017-05-02 DIAGNOSIS — G309 Alzheimer's disease, unspecified: Secondary | ICD-10-CM | POA: Diagnosis not present

## 2017-05-02 DIAGNOSIS — R1310 Dysphagia, unspecified: Secondary | ICD-10-CM | POA: Diagnosis not present

## 2017-05-03 DIAGNOSIS — F339 Major depressive disorder, recurrent, unspecified: Secondary | ICD-10-CM | POA: Diagnosis not present

## 2017-05-03 DIAGNOSIS — G309 Alzheimer's disease, unspecified: Secondary | ICD-10-CM | POA: Diagnosis not present

## 2017-05-03 DIAGNOSIS — H04129 Dry eye syndrome of unspecified lacrimal gland: Secondary | ICD-10-CM | POA: Diagnosis not present

## 2017-05-03 DIAGNOSIS — I672 Cerebral atherosclerosis: Secondary | ICD-10-CM | POA: Diagnosis not present

## 2017-05-03 DIAGNOSIS — I679 Cerebrovascular disease, unspecified: Secondary | ICD-10-CM | POA: Diagnosis not present

## 2017-05-03 DIAGNOSIS — R1310 Dysphagia, unspecified: Secondary | ICD-10-CM | POA: Diagnosis not present

## 2017-05-06 DIAGNOSIS — H04129 Dry eye syndrome of unspecified lacrimal gland: Secondary | ICD-10-CM | POA: Diagnosis not present

## 2017-05-06 DIAGNOSIS — F339 Major depressive disorder, recurrent, unspecified: Secondary | ICD-10-CM | POA: Diagnosis not present

## 2017-05-06 DIAGNOSIS — G309 Alzheimer's disease, unspecified: Secondary | ICD-10-CM | POA: Diagnosis not present

## 2017-05-06 DIAGNOSIS — R1310 Dysphagia, unspecified: Secondary | ICD-10-CM | POA: Diagnosis not present

## 2017-05-06 DIAGNOSIS — I672 Cerebral atherosclerosis: Secondary | ICD-10-CM | POA: Diagnosis not present

## 2017-05-06 DIAGNOSIS — I679 Cerebrovascular disease, unspecified: Secondary | ICD-10-CM | POA: Diagnosis not present

## 2017-05-08 DIAGNOSIS — I679 Cerebrovascular disease, unspecified: Secondary | ICD-10-CM | POA: Diagnosis not present

## 2017-05-08 DIAGNOSIS — R1310 Dysphagia, unspecified: Secondary | ICD-10-CM | POA: Diagnosis not present

## 2017-05-08 DIAGNOSIS — F339 Major depressive disorder, recurrent, unspecified: Secondary | ICD-10-CM | POA: Diagnosis not present

## 2017-05-08 DIAGNOSIS — I672 Cerebral atherosclerosis: Secondary | ICD-10-CM | POA: Diagnosis not present

## 2017-05-08 DIAGNOSIS — F0390 Unspecified dementia without behavioral disturbance: Secondary | ICD-10-CM | POA: Diagnosis not present

## 2017-05-08 DIAGNOSIS — G309 Alzheimer's disease, unspecified: Secondary | ICD-10-CM | POA: Diagnosis not present

## 2017-05-08 DIAGNOSIS — H04129 Dry eye syndrome of unspecified lacrimal gland: Secondary | ICD-10-CM | POA: Diagnosis not present

## 2017-05-09 DIAGNOSIS — F339 Major depressive disorder, recurrent, unspecified: Secondary | ICD-10-CM | POA: Diagnosis not present

## 2017-05-09 DIAGNOSIS — H04129 Dry eye syndrome of unspecified lacrimal gland: Secondary | ICD-10-CM | POA: Diagnosis not present

## 2017-05-09 DIAGNOSIS — I679 Cerebrovascular disease, unspecified: Secondary | ICD-10-CM | POA: Diagnosis not present

## 2017-05-09 DIAGNOSIS — I672 Cerebral atherosclerosis: Secondary | ICD-10-CM | POA: Diagnosis not present

## 2017-05-09 DIAGNOSIS — G309 Alzheimer's disease, unspecified: Secondary | ICD-10-CM | POA: Diagnosis not present

## 2017-05-09 DIAGNOSIS — R1310 Dysphagia, unspecified: Secondary | ICD-10-CM | POA: Diagnosis not present

## 2017-05-13 DIAGNOSIS — F339 Major depressive disorder, recurrent, unspecified: Secondary | ICD-10-CM | POA: Diagnosis not present

## 2017-05-13 DIAGNOSIS — H04129 Dry eye syndrome of unspecified lacrimal gland: Secondary | ICD-10-CM | POA: Diagnosis not present

## 2017-05-13 DIAGNOSIS — R32 Unspecified urinary incontinence: Secondary | ICD-10-CM | POA: Diagnosis not present

## 2017-05-13 DIAGNOSIS — R1310 Dysphagia, unspecified: Secondary | ICD-10-CM | POA: Diagnosis not present

## 2017-05-13 DIAGNOSIS — F419 Anxiety disorder, unspecified: Secondary | ICD-10-CM | POA: Diagnosis not present

## 2017-05-13 DIAGNOSIS — I679 Cerebrovascular disease, unspecified: Secondary | ICD-10-CM | POA: Diagnosis not present

## 2017-05-13 DIAGNOSIS — G309 Alzheimer's disease, unspecified: Secondary | ICD-10-CM | POA: Diagnosis not present

## 2017-05-13 DIAGNOSIS — I672 Cerebral atherosclerosis: Secondary | ICD-10-CM | POA: Diagnosis not present

## 2017-05-15 DIAGNOSIS — I679 Cerebrovascular disease, unspecified: Secondary | ICD-10-CM | POA: Diagnosis not present

## 2017-05-15 DIAGNOSIS — R1310 Dysphagia, unspecified: Secondary | ICD-10-CM | POA: Diagnosis not present

## 2017-05-15 DIAGNOSIS — I672 Cerebral atherosclerosis: Secondary | ICD-10-CM | POA: Diagnosis not present

## 2017-05-15 DIAGNOSIS — F339 Major depressive disorder, recurrent, unspecified: Secondary | ICD-10-CM | POA: Diagnosis not present

## 2017-05-15 DIAGNOSIS — H04129 Dry eye syndrome of unspecified lacrimal gland: Secondary | ICD-10-CM | POA: Diagnosis not present

## 2017-05-15 DIAGNOSIS — G309 Alzheimer's disease, unspecified: Secondary | ICD-10-CM | POA: Diagnosis not present

## 2017-05-16 DIAGNOSIS — F339 Major depressive disorder, recurrent, unspecified: Secondary | ICD-10-CM | POA: Diagnosis not present

## 2017-05-16 DIAGNOSIS — R1310 Dysphagia, unspecified: Secondary | ICD-10-CM | POA: Diagnosis not present

## 2017-05-16 DIAGNOSIS — I672 Cerebral atherosclerosis: Secondary | ICD-10-CM | POA: Diagnosis not present

## 2017-05-16 DIAGNOSIS — I679 Cerebrovascular disease, unspecified: Secondary | ICD-10-CM | POA: Diagnosis not present

## 2017-05-16 DIAGNOSIS — G309 Alzheimer's disease, unspecified: Secondary | ICD-10-CM | POA: Diagnosis not present

## 2017-05-16 DIAGNOSIS — H04129 Dry eye syndrome of unspecified lacrimal gland: Secondary | ICD-10-CM | POA: Diagnosis not present

## 2017-05-22 DIAGNOSIS — I679 Cerebrovascular disease, unspecified: Secondary | ICD-10-CM | POA: Diagnosis not present

## 2017-05-22 DIAGNOSIS — F339 Major depressive disorder, recurrent, unspecified: Secondary | ICD-10-CM | POA: Diagnosis not present

## 2017-05-22 DIAGNOSIS — R1310 Dysphagia, unspecified: Secondary | ICD-10-CM | POA: Diagnosis not present

## 2017-05-22 DIAGNOSIS — H04129 Dry eye syndrome of unspecified lacrimal gland: Secondary | ICD-10-CM | POA: Diagnosis not present

## 2017-05-22 DIAGNOSIS — G309 Alzheimer's disease, unspecified: Secondary | ICD-10-CM | POA: Diagnosis not present

## 2017-05-22 DIAGNOSIS — I672 Cerebral atherosclerosis: Secondary | ICD-10-CM | POA: Diagnosis not present

## 2017-05-24 DIAGNOSIS — I679 Cerebrovascular disease, unspecified: Secondary | ICD-10-CM | POA: Diagnosis not present

## 2017-05-24 DIAGNOSIS — G309 Alzheimer's disease, unspecified: Secondary | ICD-10-CM | POA: Diagnosis not present

## 2017-05-24 DIAGNOSIS — H04129 Dry eye syndrome of unspecified lacrimal gland: Secondary | ICD-10-CM | POA: Diagnosis not present

## 2017-05-24 DIAGNOSIS — F339 Major depressive disorder, recurrent, unspecified: Secondary | ICD-10-CM | POA: Diagnosis not present

## 2017-05-24 DIAGNOSIS — R1310 Dysphagia, unspecified: Secondary | ICD-10-CM | POA: Diagnosis not present

## 2017-05-24 DIAGNOSIS — I672 Cerebral atherosclerosis: Secondary | ICD-10-CM | POA: Diagnosis not present

## 2017-05-28 DIAGNOSIS — F339 Major depressive disorder, recurrent, unspecified: Secondary | ICD-10-CM | POA: Diagnosis not present

## 2017-05-28 DIAGNOSIS — I672 Cerebral atherosclerosis: Secondary | ICD-10-CM | POA: Diagnosis not present

## 2017-05-28 DIAGNOSIS — I679 Cerebrovascular disease, unspecified: Secondary | ICD-10-CM | POA: Diagnosis not present

## 2017-05-28 DIAGNOSIS — G309 Alzheimer's disease, unspecified: Secondary | ICD-10-CM | POA: Diagnosis not present

## 2017-05-28 DIAGNOSIS — H04129 Dry eye syndrome of unspecified lacrimal gland: Secondary | ICD-10-CM | POA: Diagnosis not present

## 2017-05-28 DIAGNOSIS — R1310 Dysphagia, unspecified: Secondary | ICD-10-CM | POA: Diagnosis not present

## 2017-05-29 DIAGNOSIS — I679 Cerebrovascular disease, unspecified: Secondary | ICD-10-CM | POA: Diagnosis not present

## 2017-05-29 DIAGNOSIS — F339 Major depressive disorder, recurrent, unspecified: Secondary | ICD-10-CM | POA: Diagnosis not present

## 2017-05-29 DIAGNOSIS — R634 Abnormal weight loss: Secondary | ICD-10-CM | POA: Diagnosis not present

## 2017-05-29 DIAGNOSIS — I672 Cerebral atherosclerosis: Secondary | ICD-10-CM | POA: Diagnosis not present

## 2017-05-29 DIAGNOSIS — F419 Anxiety disorder, unspecified: Secondary | ICD-10-CM | POA: Diagnosis not present

## 2017-05-29 DIAGNOSIS — R1310 Dysphagia, unspecified: Secondary | ICD-10-CM | POA: Diagnosis not present

## 2017-05-29 DIAGNOSIS — G309 Alzheimer's disease, unspecified: Secondary | ICD-10-CM | POA: Diagnosis not present

## 2017-05-29 DIAGNOSIS — R32 Unspecified urinary incontinence: Secondary | ICD-10-CM | POA: Diagnosis not present

## 2017-05-29 DIAGNOSIS — H04129 Dry eye syndrome of unspecified lacrimal gland: Secondary | ICD-10-CM | POA: Diagnosis not present

## 2017-05-31 DIAGNOSIS — I672 Cerebral atherosclerosis: Secondary | ICD-10-CM | POA: Diagnosis not present

## 2017-05-31 DIAGNOSIS — R1310 Dysphagia, unspecified: Secondary | ICD-10-CM | POA: Diagnosis not present

## 2017-05-31 DIAGNOSIS — G309 Alzheimer's disease, unspecified: Secondary | ICD-10-CM | POA: Diagnosis not present

## 2017-05-31 DIAGNOSIS — I679 Cerebrovascular disease, unspecified: Secondary | ICD-10-CM | POA: Diagnosis not present

## 2017-05-31 DIAGNOSIS — H04129 Dry eye syndrome of unspecified lacrimal gland: Secondary | ICD-10-CM | POA: Diagnosis not present

## 2017-05-31 DIAGNOSIS — F339 Major depressive disorder, recurrent, unspecified: Secondary | ICD-10-CM | POA: Diagnosis not present

## 2017-06-03 DIAGNOSIS — G309 Alzheimer's disease, unspecified: Secondary | ICD-10-CM | POA: Diagnosis not present

## 2017-06-03 DIAGNOSIS — F339 Major depressive disorder, recurrent, unspecified: Secondary | ICD-10-CM | POA: Diagnosis not present

## 2017-06-03 DIAGNOSIS — R1310 Dysphagia, unspecified: Secondary | ICD-10-CM | POA: Diagnosis not present

## 2017-06-03 DIAGNOSIS — H04129 Dry eye syndrome of unspecified lacrimal gland: Secondary | ICD-10-CM | POA: Diagnosis not present

## 2017-06-03 DIAGNOSIS — I672 Cerebral atherosclerosis: Secondary | ICD-10-CM | POA: Diagnosis not present

## 2017-06-03 DIAGNOSIS — I679 Cerebrovascular disease, unspecified: Secondary | ICD-10-CM | POA: Diagnosis not present

## 2017-06-05 ENCOUNTER — Emergency Department (HOSPITAL_COMMUNITY)
Admission: EM | Admit: 2017-06-05 | Discharge: 2017-06-05 | Disposition: A | Discharge status: Home / Self Care | Attending: Emergency Medicine | Admitting: Emergency Medicine

## 2017-06-05 ENCOUNTER — Encounter (HOSPITAL_COMMUNITY): Payer: Self-pay | Admitting: Emergency Medicine

## 2017-06-05 ENCOUNTER — Emergency Department (HOSPITAL_COMMUNITY)

## 2017-06-05 DIAGNOSIS — W1830XA Fall on same level, unspecified, initial encounter: Secondary | ICD-10-CM | POA: Diagnosis not present

## 2017-06-05 DIAGNOSIS — F0390 Unspecified dementia without behavioral disturbance: Secondary | ICD-10-CM | POA: Insufficient documentation

## 2017-06-05 DIAGNOSIS — F339 Major depressive disorder, recurrent, unspecified: Secondary | ICD-10-CM | POA: Diagnosis not present

## 2017-06-05 DIAGNOSIS — Z043 Encounter for examination and observation following other accident: Secondary | ICD-10-CM | POA: Diagnosis present

## 2017-06-05 DIAGNOSIS — S0083XA Contusion of other part of head, initial encounter: Secondary | ICD-10-CM | POA: Diagnosis not present

## 2017-06-05 DIAGNOSIS — H04129 Dry eye syndrome of unspecified lacrimal gland: Secondary | ICD-10-CM | POA: Diagnosis not present

## 2017-06-05 DIAGNOSIS — R4182 Altered mental status, unspecified: Secondary | ICD-10-CM | POA: Diagnosis not present

## 2017-06-05 DIAGNOSIS — R1310 Dysphagia, unspecified: Secondary | ICD-10-CM | POA: Diagnosis not present

## 2017-06-05 DIAGNOSIS — I10 Essential (primary) hypertension: Secondary | ICD-10-CM | POA: Insufficient documentation

## 2017-06-05 DIAGNOSIS — S0003XA Contusion of scalp, initial encounter: Secondary | ICD-10-CM | POA: Diagnosis not present

## 2017-06-05 DIAGNOSIS — I679 Cerebrovascular disease, unspecified: Secondary | ICD-10-CM | POA: Diagnosis not present

## 2017-06-05 DIAGNOSIS — Y939 Activity, unspecified: Secondary | ICD-10-CM | POA: Diagnosis not present

## 2017-06-05 DIAGNOSIS — W19XXXA Unspecified fall, initial encounter: Secondary | ICD-10-CM

## 2017-06-05 DIAGNOSIS — Z79899 Other long term (current) drug therapy: Secondary | ICD-10-CM | POA: Insufficient documentation

## 2017-06-05 DIAGNOSIS — Y999 Unspecified external cause status: Secondary | ICD-10-CM | POA: Diagnosis not present

## 2017-06-05 DIAGNOSIS — I672 Cerebral atherosclerosis: Secondary | ICD-10-CM | POA: Diagnosis not present

## 2017-06-05 DIAGNOSIS — S0990XA Unspecified injury of head, initial encounter: Secondary | ICD-10-CM | POA: Diagnosis not present

## 2017-06-05 DIAGNOSIS — Y92129 Unspecified place in nursing home as the place of occurrence of the external cause: Secondary | ICD-10-CM | POA: Diagnosis not present

## 2017-06-05 DIAGNOSIS — G309 Alzheimer's disease, unspecified: Secondary | ICD-10-CM | POA: Diagnosis not present
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
## 2017-06-05 NOTE — ED Triage Notes (Signed)
Pt coming from [facility] being cared for by [facility]. Pt had unwitnessed fall. Pt hit the back of her head. Small hematoma present. No bleeding, pt not on blood thinners. Pt is nonverbal per baseline. Per staff reporting, pt has been on steady decline for the last several days. Staff reports pt has not been taking medications and is hypertensive.

## 2017-06-05 NOTE — ED Notes (Signed)
Bed: FE76 Expected date:  Expected time:  Means of arrival:  Comments: EMS 83 yo female from assisted living-fall-hematoma back of head-no blood thinners

## 2017-06-05 NOTE — Discharge Instructions (Signed)
Please follow with your primary care doctor in the next 2 days for a check-up. They must obtain records for further management.  ° °Do not hesitate to return to the Emergency Department for any new, worsening or concerning symptoms.  ° °

## 2017-06-05 NOTE — Progress Notes (Signed)
[facility] Liaison: RN visit  Patient resting/sleeping with son and daughter in law at bedside.  Waiting on test results. If CT results are WNL, she will return to [facility].   Please call for any hospice related questions.  Thank you.  Farrel Gordon, RN, [facility] Liaison [phone]  All hospital liaisons are now on [place].

## 2017-06-05 NOTE — ED Provider Notes (Signed)
Medical screening examination/treatment/procedure(s) were conducted as a shared visit with non-physician practitioner(s) and myself.  I personally evaluated the patient during the encounter.   EKG Interpretation None     pt here after unwitnessed fall at [facility] Small hematoma noted Head and neck ct neg Will d/c back to [facility], Elberta Fortis, MD 06/05/17 [phone]

## 2017-06-06 DIAGNOSIS — I679 Cerebrovascular disease, unspecified: Secondary | ICD-10-CM | POA: Diagnosis not present

## 2017-06-06 DIAGNOSIS — R1310 Dysphagia, unspecified: Secondary | ICD-10-CM | POA: Diagnosis not present

## 2017-06-06 DIAGNOSIS — I672 Cerebral atherosclerosis: Secondary | ICD-10-CM | POA: Diagnosis not present

## 2017-06-06 DIAGNOSIS — H04129 Dry eye syndrome of unspecified lacrimal gland: Secondary | ICD-10-CM | POA: Diagnosis not present

## 2017-06-06 DIAGNOSIS — G309 Alzheimer's disease, unspecified: Secondary | ICD-10-CM | POA: Diagnosis not present

## 2017-06-06 DIAGNOSIS — F339 Major depressive disorder, recurrent, unspecified: Secondary | ICD-10-CM | POA: Diagnosis not present

## 2017-06-07 DIAGNOSIS — I679 Cerebrovascular disease, unspecified: Secondary | ICD-10-CM | POA: Diagnosis not present

## 2017-06-07 DIAGNOSIS — H04129 Dry eye syndrome of unspecified lacrimal gland: Secondary | ICD-10-CM | POA: Diagnosis not present

## 2017-06-07 DIAGNOSIS — R1310 Dysphagia, unspecified: Secondary | ICD-10-CM | POA: Diagnosis not present

## 2017-06-07 DIAGNOSIS — G309 Alzheimer's disease, unspecified: Secondary | ICD-10-CM | POA: Diagnosis not present

## 2017-06-07 DIAGNOSIS — I672 Cerebral atherosclerosis: Secondary | ICD-10-CM | POA: Diagnosis not present

## 2017-06-07 DIAGNOSIS — F339 Major depressive disorder, recurrent, unspecified: Secondary | ICD-10-CM | POA: Diagnosis not present

## 2017-06-08 DIAGNOSIS — G309 Alzheimer's disease, unspecified: Secondary | ICD-10-CM | POA: Diagnosis not present

## 2017-06-08 DIAGNOSIS — I672 Cerebral atherosclerosis: Secondary | ICD-10-CM | POA: Diagnosis not present

## 2017-06-08 DIAGNOSIS — F339 Major depressive disorder, recurrent, unspecified: Secondary | ICD-10-CM | POA: Diagnosis not present

## 2017-06-08 DIAGNOSIS — I679 Cerebrovascular disease, unspecified: Secondary | ICD-10-CM | POA: Diagnosis not present

## 2017-06-08 DIAGNOSIS — H04129 Dry eye syndrome of unspecified lacrimal gland: Secondary | ICD-10-CM | POA: Diagnosis not present

## 2017-06-08 DIAGNOSIS — R1310 Dysphagia, unspecified: Secondary | ICD-10-CM | POA: Diagnosis not present

## 2017-06-09 DIAGNOSIS — G309 Alzheimer's disease, unspecified: Secondary | ICD-10-CM | POA: Diagnosis not present

## 2017-06-09 DIAGNOSIS — R1310 Dysphagia, unspecified: Secondary | ICD-10-CM | POA: Diagnosis not present

## 2017-06-09 DIAGNOSIS — I679 Cerebrovascular disease, unspecified: Secondary | ICD-10-CM | POA: Diagnosis not present

## 2017-06-09 DIAGNOSIS — I672 Cerebral atherosclerosis: Secondary | ICD-10-CM | POA: Diagnosis not present

## 2017-06-09 DIAGNOSIS — H04129 Dry eye syndrome of unspecified lacrimal gland: Secondary | ICD-10-CM | POA: Diagnosis not present

## 2017-06-09 DIAGNOSIS — F339 Major depressive disorder, recurrent, unspecified: Secondary | ICD-10-CM | POA: Diagnosis not present

## 2017-06-10 DIAGNOSIS — R1310 Dysphagia, unspecified: Secondary | ICD-10-CM | POA: Diagnosis not present

## 2017-06-10 DIAGNOSIS — G309 Alzheimer's disease, unspecified: Secondary | ICD-10-CM | POA: Diagnosis not present

## 2017-06-10 DIAGNOSIS — F339 Major depressive disorder, recurrent, unspecified: Secondary | ICD-10-CM | POA: Diagnosis not present

## 2017-06-10 DIAGNOSIS — H04129 Dry eye syndrome of unspecified lacrimal gland: Secondary | ICD-10-CM | POA: Diagnosis not present

## 2017-06-10 DIAGNOSIS — I679 Cerebrovascular disease, unspecified: Secondary | ICD-10-CM | POA: Diagnosis not present

## 2017-06-10 DIAGNOSIS — I672 Cerebral atherosclerosis: Secondary | ICD-10-CM | POA: Diagnosis not present

## 2017-06-12 DIAGNOSIS — H04129 Dry eye syndrome of unspecified lacrimal gland: Secondary | ICD-10-CM | POA: Diagnosis not present

## 2017-06-12 DIAGNOSIS — G309 Alzheimer's disease, unspecified: Secondary | ICD-10-CM | POA: Diagnosis not present

## 2017-06-12 DIAGNOSIS — F419 Anxiety disorder, unspecified: Secondary | ICD-10-CM | POA: Diagnosis not present

## 2017-06-12 DIAGNOSIS — I672 Cerebral atherosclerosis: Secondary | ICD-10-CM | POA: Diagnosis not present

## 2017-06-12 DIAGNOSIS — I679 Cerebrovascular disease, unspecified: Secondary | ICD-10-CM | POA: Diagnosis not present

## 2017-06-12 DIAGNOSIS — F339 Major depressive disorder, recurrent, unspecified: Secondary | ICD-10-CM | POA: Diagnosis not present

## 2017-06-12 DIAGNOSIS — R1310 Dysphagia, unspecified: Secondary | ICD-10-CM | POA: Diagnosis not present

## 2017-06-12 DIAGNOSIS — K5900 Constipation, unspecified: Secondary | ICD-10-CM | POA: Diagnosis not present

## 2017-06-12 DIAGNOSIS — R32 Unspecified urinary incontinence: Secondary | ICD-10-CM | POA: Diagnosis not present

## 2017-06-13 DIAGNOSIS — H04129 Dry eye syndrome of unspecified lacrimal gland: Secondary | ICD-10-CM | POA: Diagnosis not present

## 2017-06-13 DIAGNOSIS — I672 Cerebral atherosclerosis: Secondary | ICD-10-CM | POA: Diagnosis not present

## 2017-06-13 DIAGNOSIS — G309 Alzheimer's disease, unspecified: Secondary | ICD-10-CM | POA: Diagnosis not present

## 2017-06-13 DIAGNOSIS — F339 Major depressive disorder, recurrent, unspecified: Secondary | ICD-10-CM | POA: Diagnosis not present

## 2017-06-13 DIAGNOSIS — R1310 Dysphagia, unspecified: Secondary | ICD-10-CM | POA: Diagnosis not present

## 2017-06-13 DIAGNOSIS — I679 Cerebrovascular disease, unspecified: Secondary | ICD-10-CM | POA: Diagnosis not present

## 2017-06-15 DIAGNOSIS — F339 Major depressive disorder, recurrent, unspecified: Secondary | ICD-10-CM | POA: Diagnosis not present

## 2017-06-15 DIAGNOSIS — R1310 Dysphagia, unspecified: Secondary | ICD-10-CM | POA: Diagnosis not present

## 2017-06-15 DIAGNOSIS — I679 Cerebrovascular disease, unspecified: Secondary | ICD-10-CM | POA: Diagnosis not present

## 2017-06-15 DIAGNOSIS — G309 Alzheimer's disease, unspecified: Secondary | ICD-10-CM | POA: Diagnosis not present

## 2017-06-15 DIAGNOSIS — H04129 Dry eye syndrome of unspecified lacrimal gland: Secondary | ICD-10-CM | POA: Diagnosis not present

## 2017-06-15 DIAGNOSIS — I672 Cerebral atherosclerosis: Secondary | ICD-10-CM | POA: Diagnosis not present

## 2017-06-18 DIAGNOSIS — I679 Cerebrovascular disease, unspecified: Secondary | ICD-10-CM | POA: Diagnosis not present

## 2017-06-18 DIAGNOSIS — H04129 Dry eye syndrome of unspecified lacrimal gland: Secondary | ICD-10-CM | POA: Diagnosis not present

## 2017-06-18 DIAGNOSIS — I672 Cerebral atherosclerosis: Secondary | ICD-10-CM | POA: Diagnosis not present

## 2017-06-18 DIAGNOSIS — F339 Major depressive disorder, recurrent, unspecified: Secondary | ICD-10-CM | POA: Diagnosis not present

## 2017-06-18 DIAGNOSIS — R1310 Dysphagia, unspecified: Secondary | ICD-10-CM | POA: Diagnosis not present

## 2017-06-18 DIAGNOSIS — G309 Alzheimer's disease, unspecified: Secondary | ICD-10-CM | POA: Diagnosis not present

## 2017-06-19 DIAGNOSIS — I679 Cerebrovascular disease, unspecified: Secondary | ICD-10-CM | POA: Diagnosis not present

## 2017-06-19 DIAGNOSIS — F339 Major depressive disorder, recurrent, unspecified: Secondary | ICD-10-CM | POA: Diagnosis not present

## 2017-06-19 DIAGNOSIS — G309 Alzheimer's disease, unspecified: Secondary | ICD-10-CM | POA: Diagnosis not present

## 2017-06-19 DIAGNOSIS — H04129 Dry eye syndrome of unspecified lacrimal gland: Secondary | ICD-10-CM | POA: Diagnosis not present

## 2017-06-19 DIAGNOSIS — R1310 Dysphagia, unspecified: Secondary | ICD-10-CM | POA: Diagnosis not present

## 2017-06-19 DIAGNOSIS — I672 Cerebral atherosclerosis: Secondary | ICD-10-CM | POA: Diagnosis not present

## 2017-06-21 DIAGNOSIS — F339 Major depressive disorder, recurrent, unspecified: Secondary | ICD-10-CM | POA: Diagnosis not present

## 2017-06-21 DIAGNOSIS — G309 Alzheimer's disease, unspecified: Secondary | ICD-10-CM | POA: Diagnosis not present

## 2017-06-21 DIAGNOSIS — I672 Cerebral atherosclerosis: Secondary | ICD-10-CM | POA: Diagnosis not present

## 2017-06-21 DIAGNOSIS — I679 Cerebrovascular disease, unspecified: Secondary | ICD-10-CM | POA: Diagnosis not present

## 2017-06-21 DIAGNOSIS — R1310 Dysphagia, unspecified: Secondary | ICD-10-CM | POA: Diagnosis not present

## 2017-06-21 DIAGNOSIS — H04129 Dry eye syndrome of unspecified lacrimal gland: Secondary | ICD-10-CM | POA: Diagnosis not present

## 2017-06-24 DIAGNOSIS — G309 Alzheimer's disease, unspecified: Secondary | ICD-10-CM | POA: Diagnosis not present

## 2017-06-24 DIAGNOSIS — I672 Cerebral atherosclerosis: Secondary | ICD-10-CM | POA: Diagnosis not present

## 2017-06-24 DIAGNOSIS — H04129 Dry eye syndrome of unspecified lacrimal gland: Secondary | ICD-10-CM | POA: Diagnosis not present

## 2017-06-24 DIAGNOSIS — I679 Cerebrovascular disease, unspecified: Secondary | ICD-10-CM | POA: Diagnosis not present

## 2017-06-24 DIAGNOSIS — R1310 Dysphagia, unspecified: Secondary | ICD-10-CM | POA: Diagnosis not present

## 2017-06-24 DIAGNOSIS — F339 Major depressive disorder, recurrent, unspecified: Secondary | ICD-10-CM | POA: Diagnosis not present

## 2017-06-26 DIAGNOSIS — K5900 Constipation, unspecified: Secondary | ICD-10-CM | POA: Diagnosis not present

## 2017-06-26 DIAGNOSIS — R1310 Dysphagia, unspecified: Secondary | ICD-10-CM | POA: Diagnosis not present

## 2017-06-26 DIAGNOSIS — G309 Alzheimer's disease, unspecified: Secondary | ICD-10-CM | POA: Diagnosis not present

## 2017-06-26 DIAGNOSIS — H04129 Dry eye syndrome of unspecified lacrimal gland: Secondary | ICD-10-CM | POA: Diagnosis not present

## 2017-06-26 DIAGNOSIS — G4700 Insomnia, unspecified: Secondary | ICD-10-CM | POA: Diagnosis not present

## 2017-06-26 DIAGNOSIS — F339 Major depressive disorder, recurrent, unspecified: Secondary | ICD-10-CM | POA: Diagnosis not present

## 2017-06-26 DIAGNOSIS — I679 Cerebrovascular disease, unspecified: Secondary | ICD-10-CM | POA: Diagnosis not present

## 2017-06-26 DIAGNOSIS — I672 Cerebral atherosclerosis: Secondary | ICD-10-CM | POA: Diagnosis not present

## 2017-06-26 DIAGNOSIS — F0390 Unspecified dementia without behavioral disturbance: Secondary | ICD-10-CM | POA: Diagnosis not present

## 2017-06-28 DIAGNOSIS — I679 Cerebrovascular disease, unspecified: Secondary | ICD-10-CM | POA: Diagnosis not present

## 2017-06-28 DIAGNOSIS — F339 Major depressive disorder, recurrent, unspecified: Secondary | ICD-10-CM | POA: Diagnosis not present

## 2017-06-28 DIAGNOSIS — I672 Cerebral atherosclerosis: Secondary | ICD-10-CM | POA: Diagnosis not present

## 2017-06-28 DIAGNOSIS — H04129 Dry eye syndrome of unspecified lacrimal gland: Secondary | ICD-10-CM | POA: Diagnosis not present

## 2017-06-28 DIAGNOSIS — R1310 Dysphagia, unspecified: Secondary | ICD-10-CM | POA: Diagnosis not present

## 2017-06-28 DIAGNOSIS — G309 Alzheimer's disease, unspecified: Secondary | ICD-10-CM | POA: Diagnosis not present

## 2017-07-01 DIAGNOSIS — H04129 Dry eye syndrome of unspecified lacrimal gland: Secondary | ICD-10-CM | POA: Diagnosis not present

## 2017-07-01 DIAGNOSIS — I672 Cerebral atherosclerosis: Secondary | ICD-10-CM | POA: Diagnosis not present

## 2017-07-01 DIAGNOSIS — F339 Major depressive disorder, recurrent, unspecified: Secondary | ICD-10-CM | POA: Diagnosis not present

## 2017-07-01 DIAGNOSIS — R1310 Dysphagia, unspecified: Secondary | ICD-10-CM | POA: Diagnosis not present

## 2017-07-01 DIAGNOSIS — G309 Alzheimer's disease, unspecified: Secondary | ICD-10-CM | POA: Diagnosis not present

## 2017-07-01 DIAGNOSIS — I679 Cerebrovascular disease, unspecified: Secondary | ICD-10-CM | POA: Diagnosis not present

## 2017-07-03 DIAGNOSIS — H04129 Dry eye syndrome of unspecified lacrimal gland: Secondary | ICD-10-CM | POA: Diagnosis not present

## 2017-07-03 DIAGNOSIS — F339 Major depressive disorder, recurrent, unspecified: Secondary | ICD-10-CM | POA: Diagnosis not present

## 2017-07-03 DIAGNOSIS — I679 Cerebrovascular disease, unspecified: Secondary | ICD-10-CM | POA: Diagnosis not present

## 2017-07-03 DIAGNOSIS — I672 Cerebral atherosclerosis: Secondary | ICD-10-CM | POA: Diagnosis not present

## 2017-07-03 DIAGNOSIS — G309 Alzheimer's disease, unspecified: Secondary | ICD-10-CM | POA: Diagnosis not present

## 2017-07-03 DIAGNOSIS — R1310 Dysphagia, unspecified: Secondary | ICD-10-CM | POA: Diagnosis not present

## 2017-07-05 DIAGNOSIS — F339 Major depressive disorder, recurrent, unspecified: Secondary | ICD-10-CM | POA: Diagnosis not present

## 2017-07-05 DIAGNOSIS — I672 Cerebral atherosclerosis: Secondary | ICD-10-CM | POA: Diagnosis not present

## 2017-07-05 DIAGNOSIS — H04129 Dry eye syndrome of unspecified lacrimal gland: Secondary | ICD-10-CM | POA: Diagnosis not present

## 2017-07-05 DIAGNOSIS — R1310 Dysphagia, unspecified: Secondary | ICD-10-CM | POA: Diagnosis not present

## 2017-07-05 DIAGNOSIS — G309 Alzheimer's disease, unspecified: Secondary | ICD-10-CM | POA: Diagnosis not present

## 2017-07-05 DIAGNOSIS — I679 Cerebrovascular disease, unspecified: Secondary | ICD-10-CM | POA: Diagnosis not present

## 2017-07-09 DIAGNOSIS — I679 Cerebrovascular disease, unspecified: Secondary | ICD-10-CM | POA: Diagnosis not present

## 2017-07-09 DIAGNOSIS — H04129 Dry eye syndrome of unspecified lacrimal gland: Secondary | ICD-10-CM | POA: Diagnosis not present

## 2017-07-09 DIAGNOSIS — F339 Major depressive disorder, recurrent, unspecified: Secondary | ICD-10-CM | POA: Diagnosis not present

## 2017-07-09 DIAGNOSIS — R32 Unspecified urinary incontinence: Secondary | ICD-10-CM | POA: Diagnosis not present

## 2017-07-09 DIAGNOSIS — I672 Cerebral atherosclerosis: Secondary | ICD-10-CM | POA: Diagnosis not present

## 2017-07-09 DIAGNOSIS — K5900 Constipation, unspecified: Secondary | ICD-10-CM | POA: Diagnosis not present

## 2017-07-09 DIAGNOSIS — R1310 Dysphagia, unspecified: Secondary | ICD-10-CM | POA: Diagnosis not present

## 2017-07-09 DIAGNOSIS — G309 Alzheimer's disease, unspecified: Secondary | ICD-10-CM | POA: Diagnosis not present

## 2017-07-09 DIAGNOSIS — F419 Anxiety disorder, unspecified: Secondary | ICD-10-CM | POA: Diagnosis not present

## 2017-07-10 DIAGNOSIS — F339 Major depressive disorder, recurrent, unspecified: Secondary | ICD-10-CM | POA: Diagnosis not present

## 2017-07-10 DIAGNOSIS — G309 Alzheimer's disease, unspecified: Secondary | ICD-10-CM | POA: Diagnosis not present

## 2017-07-10 DIAGNOSIS — I679 Cerebrovascular disease, unspecified: Secondary | ICD-10-CM | POA: Diagnosis not present

## 2017-07-10 DIAGNOSIS — R1310 Dysphagia, unspecified: Secondary | ICD-10-CM | POA: Diagnosis not present

## 2017-07-10 DIAGNOSIS — H04129 Dry eye syndrome of unspecified lacrimal gland: Secondary | ICD-10-CM | POA: Diagnosis not present

## 2017-07-10 DIAGNOSIS — I672 Cerebral atherosclerosis: Secondary | ICD-10-CM | POA: Diagnosis not present

## 2017-07-11 DIAGNOSIS — I679 Cerebrovascular disease, unspecified: Secondary | ICD-10-CM | POA: Diagnosis not present

## 2017-07-11 DIAGNOSIS — G309 Alzheimer's disease, unspecified: Secondary | ICD-10-CM | POA: Diagnosis not present

## 2017-07-11 DIAGNOSIS — H04129 Dry eye syndrome of unspecified lacrimal gland: Secondary | ICD-10-CM | POA: Diagnosis not present

## 2017-07-11 DIAGNOSIS — I672 Cerebral atherosclerosis: Secondary | ICD-10-CM | POA: Diagnosis not present

## 2017-07-11 DIAGNOSIS — F339 Major depressive disorder, recurrent, unspecified: Secondary | ICD-10-CM | POA: Diagnosis not present

## 2017-07-11 DIAGNOSIS — R1310 Dysphagia, unspecified: Secondary | ICD-10-CM | POA: Diagnosis not present

## 2017-07-13 DIAGNOSIS — F339 Major depressive disorder, recurrent, unspecified: Secondary | ICD-10-CM | POA: Diagnosis not present

## 2017-07-13 DIAGNOSIS — I672 Cerebral atherosclerosis: Secondary | ICD-10-CM | POA: Diagnosis not present

## 2017-07-13 DIAGNOSIS — R1310 Dysphagia, unspecified: Secondary | ICD-10-CM | POA: Diagnosis not present

## 2017-07-13 DIAGNOSIS — I679 Cerebrovascular disease, unspecified: Secondary | ICD-10-CM | POA: Diagnosis not present

## 2017-07-13 DIAGNOSIS — H04129 Dry eye syndrome of unspecified lacrimal gland: Secondary | ICD-10-CM | POA: Diagnosis not present

## 2017-07-13 DIAGNOSIS — G309 Alzheimer's disease, unspecified: Secondary | ICD-10-CM | POA: Diagnosis not present

## 2017-07-15 DIAGNOSIS — I672 Cerebral atherosclerosis: Secondary | ICD-10-CM | POA: Diagnosis not present

## 2017-07-15 DIAGNOSIS — G309 Alzheimer's disease, unspecified: Secondary | ICD-10-CM | POA: Diagnosis not present

## 2017-07-15 DIAGNOSIS — F339 Major depressive disorder, recurrent, unspecified: Secondary | ICD-10-CM | POA: Diagnosis not present

## 2017-07-15 DIAGNOSIS — I679 Cerebrovascular disease, unspecified: Secondary | ICD-10-CM | POA: Diagnosis not present

## 2017-07-15 DIAGNOSIS — H04129 Dry eye syndrome of unspecified lacrimal gland: Secondary | ICD-10-CM | POA: Diagnosis not present

## 2017-07-15 DIAGNOSIS — R1310 Dysphagia, unspecified: Secondary | ICD-10-CM | POA: Diagnosis not present

## 2017-07-17 DIAGNOSIS — I672 Cerebral atherosclerosis: Secondary | ICD-10-CM | POA: Diagnosis not present

## 2017-07-17 DIAGNOSIS — H04129 Dry eye syndrome of unspecified lacrimal gland: Secondary | ICD-10-CM | POA: Diagnosis not present

## 2017-07-17 DIAGNOSIS — G309 Alzheimer's disease, unspecified: Secondary | ICD-10-CM | POA: Diagnosis not present

## 2017-07-17 DIAGNOSIS — F339 Major depressive disorder, recurrent, unspecified: Secondary | ICD-10-CM | POA: Diagnosis not present

## 2017-07-17 DIAGNOSIS — I679 Cerebrovascular disease, unspecified: Secondary | ICD-10-CM | POA: Diagnosis not present

## 2017-07-17 DIAGNOSIS — R1310 Dysphagia, unspecified: Secondary | ICD-10-CM | POA: Diagnosis not present

## 2017-07-22 DIAGNOSIS — I679 Cerebrovascular disease, unspecified: Secondary | ICD-10-CM | POA: Diagnosis not present

## 2017-07-22 DIAGNOSIS — R1310 Dysphagia, unspecified: Secondary | ICD-10-CM | POA: Diagnosis not present

## 2017-07-22 DIAGNOSIS — H04129 Dry eye syndrome of unspecified lacrimal gland: Secondary | ICD-10-CM | POA: Diagnosis not present

## 2017-07-22 DIAGNOSIS — G309 Alzheimer's disease, unspecified: Secondary | ICD-10-CM | POA: Diagnosis not present

## 2017-07-22 DIAGNOSIS — I672 Cerebral atherosclerosis: Secondary | ICD-10-CM | POA: Diagnosis not present

## 2017-07-22 DIAGNOSIS — F339 Major depressive disorder, recurrent, unspecified: Secondary | ICD-10-CM | POA: Diagnosis not present

## 2017-07-24 DIAGNOSIS — I679 Cerebrovascular disease, unspecified: Secondary | ICD-10-CM | POA: Diagnosis not present

## 2017-07-24 DIAGNOSIS — H04129 Dry eye syndrome of unspecified lacrimal gland: Secondary | ICD-10-CM | POA: Diagnosis not present

## 2017-07-24 DIAGNOSIS — I672 Cerebral atherosclerosis: Secondary | ICD-10-CM | POA: Diagnosis not present

## 2017-07-24 DIAGNOSIS — F0390 Unspecified dementia without behavioral disturbance: Secondary | ICD-10-CM | POA: Diagnosis not present

## 2017-07-24 DIAGNOSIS — R1310 Dysphagia, unspecified: Secondary | ICD-10-CM | POA: Diagnosis not present

## 2017-07-24 DIAGNOSIS — K5900 Constipation, unspecified: Secondary | ICD-10-CM | POA: Diagnosis not present

## 2017-07-24 DIAGNOSIS — G4700 Insomnia, unspecified: Secondary | ICD-10-CM | POA: Diagnosis not present

## 2017-07-24 DIAGNOSIS — F339 Major depressive disorder, recurrent, unspecified: Secondary | ICD-10-CM | POA: Diagnosis not present

## 2017-07-24 DIAGNOSIS — H9190 Unspecified hearing loss, unspecified ear: Secondary | ICD-10-CM | POA: Diagnosis not present

## 2017-07-24 DIAGNOSIS — M8580 Other specified disorders of bone density and structure, unspecified site: Secondary | ICD-10-CM | POA: Diagnosis not present

## 2017-07-24 DIAGNOSIS — G309 Alzheimer's disease, unspecified: Secondary | ICD-10-CM | POA: Diagnosis not present

## 2017-07-24 DIAGNOSIS — I10 Essential (primary) hypertension: Secondary | ICD-10-CM | POA: Diagnosis not present

## 2017-07-26 DIAGNOSIS — I679 Cerebrovascular disease, unspecified: Secondary | ICD-10-CM | POA: Diagnosis not present

## 2017-07-26 DIAGNOSIS — G309 Alzheimer's disease, unspecified: Secondary | ICD-10-CM | POA: Diagnosis not present

## 2017-07-26 DIAGNOSIS — R1310 Dysphagia, unspecified: Secondary | ICD-10-CM | POA: Diagnosis not present

## 2017-07-26 DIAGNOSIS — F339 Major depressive disorder, recurrent, unspecified: Secondary | ICD-10-CM | POA: Diagnosis not present

## 2017-07-26 DIAGNOSIS — I672 Cerebral atherosclerosis: Secondary | ICD-10-CM | POA: Diagnosis not present

## 2017-07-26 DIAGNOSIS — H04129 Dry eye syndrome of unspecified lacrimal gland: Secondary | ICD-10-CM | POA: Diagnosis not present

## 2017-07-29 DIAGNOSIS — I679 Cerebrovascular disease, unspecified: Secondary | ICD-10-CM | POA: Diagnosis not present

## 2017-07-29 DIAGNOSIS — R1310 Dysphagia, unspecified: Secondary | ICD-10-CM | POA: Diagnosis not present

## 2017-07-29 DIAGNOSIS — H04129 Dry eye syndrome of unspecified lacrimal gland: Secondary | ICD-10-CM | POA: Diagnosis not present

## 2017-07-29 DIAGNOSIS — F339 Major depressive disorder, recurrent, unspecified: Secondary | ICD-10-CM | POA: Diagnosis not present

## 2017-07-29 DIAGNOSIS — G309 Alzheimer's disease, unspecified: Secondary | ICD-10-CM | POA: Diagnosis not present

## 2017-07-29 DIAGNOSIS — I672 Cerebral atherosclerosis: Secondary | ICD-10-CM | POA: Diagnosis not present

## 2017-07-31 DIAGNOSIS — H04129 Dry eye syndrome of unspecified lacrimal gland: Secondary | ICD-10-CM | POA: Diagnosis not present

## 2017-07-31 DIAGNOSIS — F339 Major depressive disorder, recurrent, unspecified: Secondary | ICD-10-CM | POA: Diagnosis not present

## 2017-07-31 DIAGNOSIS — R1310 Dysphagia, unspecified: Secondary | ICD-10-CM | POA: Diagnosis not present

## 2017-07-31 DIAGNOSIS — I672 Cerebral atherosclerosis: Secondary | ICD-10-CM | POA: Diagnosis not present

## 2017-07-31 DIAGNOSIS — I679 Cerebrovascular disease, unspecified: Secondary | ICD-10-CM | POA: Diagnosis not present

## 2017-07-31 DIAGNOSIS — G309 Alzheimer's disease, unspecified: Secondary | ICD-10-CM | POA: Diagnosis not present

## 2017-08-01 DIAGNOSIS — R1310 Dysphagia, unspecified: Secondary | ICD-10-CM | POA: Diagnosis not present

## 2017-08-01 DIAGNOSIS — F331 Major depressive disorder, recurrent, moderate: Secondary | ICD-10-CM | POA: Diagnosis not present

## 2017-08-01 DIAGNOSIS — F0391 Unspecified dementia with behavioral disturbance: Secondary | ICD-10-CM | POA: Diagnosis not present

## 2017-08-01 DIAGNOSIS — F339 Major depressive disorder, recurrent, unspecified: Secondary | ICD-10-CM | POA: Diagnosis not present

## 2017-08-01 DIAGNOSIS — I672 Cerebral atherosclerosis: Secondary | ICD-10-CM | POA: Diagnosis not present

## 2017-08-01 DIAGNOSIS — I679 Cerebrovascular disease, unspecified: Secondary | ICD-10-CM | POA: Diagnosis not present

## 2017-08-01 DIAGNOSIS — F419 Anxiety disorder, unspecified: Secondary | ICD-10-CM | POA: Diagnosis not present

## 2017-08-01 DIAGNOSIS — H04129 Dry eye syndrome of unspecified lacrimal gland: Secondary | ICD-10-CM | POA: Diagnosis not present

## 2017-08-01 DIAGNOSIS — G309 Alzheimer's disease, unspecified: Secondary | ICD-10-CM | POA: Diagnosis not present

## 2017-08-05 DIAGNOSIS — I679 Cerebrovascular disease, unspecified: Secondary | ICD-10-CM | POA: Diagnosis not present

## 2017-08-05 DIAGNOSIS — H04129 Dry eye syndrome of unspecified lacrimal gland: Secondary | ICD-10-CM | POA: Diagnosis not present

## 2017-08-05 DIAGNOSIS — G309 Alzheimer's disease, unspecified: Secondary | ICD-10-CM | POA: Diagnosis not present

## 2017-08-05 DIAGNOSIS — I672 Cerebral atherosclerosis: Secondary | ICD-10-CM | POA: Diagnosis not present

## 2017-08-05 DIAGNOSIS — R1310 Dysphagia, unspecified: Secondary | ICD-10-CM | POA: Diagnosis not present

## 2017-08-05 DIAGNOSIS — F339 Major depressive disorder, recurrent, unspecified: Secondary | ICD-10-CM | POA: Diagnosis not present

## 2017-08-07 DIAGNOSIS — I672 Cerebral atherosclerosis: Secondary | ICD-10-CM | POA: Diagnosis not present

## 2017-08-07 DIAGNOSIS — I679 Cerebrovascular disease, unspecified: Secondary | ICD-10-CM | POA: Diagnosis not present

## 2017-08-07 DIAGNOSIS — G309 Alzheimer's disease, unspecified: Secondary | ICD-10-CM | POA: Diagnosis not present

## 2017-08-07 DIAGNOSIS — F339 Major depressive disorder, recurrent, unspecified: Secondary | ICD-10-CM | POA: Diagnosis not present

## 2017-08-07 DIAGNOSIS — H04129 Dry eye syndrome of unspecified lacrimal gland: Secondary | ICD-10-CM | POA: Diagnosis not present

## 2017-08-07 DIAGNOSIS — R1310 Dysphagia, unspecified: Secondary | ICD-10-CM | POA: Diagnosis not present

## 2017-08-08 DIAGNOSIS — H04129 Dry eye syndrome of unspecified lacrimal gland: Secondary | ICD-10-CM | POA: Diagnosis not present

## 2017-08-08 DIAGNOSIS — F339 Major depressive disorder, recurrent, unspecified: Secondary | ICD-10-CM | POA: Diagnosis not present

## 2017-08-08 DIAGNOSIS — I679 Cerebrovascular disease, unspecified: Secondary | ICD-10-CM | POA: Diagnosis not present

## 2017-08-08 DIAGNOSIS — G309 Alzheimer's disease, unspecified: Secondary | ICD-10-CM | POA: Diagnosis not present

## 2017-08-08 DIAGNOSIS — I672 Cerebral atherosclerosis: Secondary | ICD-10-CM | POA: Diagnosis not present

## 2017-08-08 DIAGNOSIS — R1310 Dysphagia, unspecified: Secondary | ICD-10-CM | POA: Diagnosis not present

## 2017-08-09 DIAGNOSIS — G309 Alzheimer's disease, unspecified: Secondary | ICD-10-CM | POA: Diagnosis not present

## 2017-08-09 DIAGNOSIS — R32 Unspecified urinary incontinence: Secondary | ICD-10-CM | POA: Diagnosis not present

## 2017-08-09 DIAGNOSIS — K5900 Constipation, unspecified: Secondary | ICD-10-CM | POA: Diagnosis not present

## 2017-08-09 DIAGNOSIS — F339 Major depressive disorder, recurrent, unspecified: Secondary | ICD-10-CM | POA: Diagnosis not present

## 2017-08-09 DIAGNOSIS — H04129 Dry eye syndrome of unspecified lacrimal gland: Secondary | ICD-10-CM | POA: Diagnosis not present

## 2017-08-09 DIAGNOSIS — R1310 Dysphagia, unspecified: Secondary | ICD-10-CM | POA: Diagnosis not present

## 2017-08-09 DIAGNOSIS — F419 Anxiety disorder, unspecified: Secondary | ICD-10-CM | POA: Diagnosis not present

## 2017-08-09 DIAGNOSIS — I672 Cerebral atherosclerosis: Secondary | ICD-10-CM | POA: Diagnosis not present

## 2017-08-09 DIAGNOSIS — I679 Cerebrovascular disease, unspecified: Secondary | ICD-10-CM | POA: Diagnosis not present

## 2017-08-12 DIAGNOSIS — R1310 Dysphagia, unspecified: Secondary | ICD-10-CM | POA: Diagnosis not present

## 2017-08-12 DIAGNOSIS — H04129 Dry eye syndrome of unspecified lacrimal gland: Secondary | ICD-10-CM | POA: Diagnosis not present

## 2017-08-12 DIAGNOSIS — I679 Cerebrovascular disease, unspecified: Secondary | ICD-10-CM | POA: Diagnosis not present

## 2017-08-12 DIAGNOSIS — I672 Cerebral atherosclerosis: Secondary | ICD-10-CM | POA: Diagnosis not present

## 2017-08-12 DIAGNOSIS — F339 Major depressive disorder, recurrent, unspecified: Secondary | ICD-10-CM | POA: Diagnosis not present

## 2017-08-12 DIAGNOSIS — G309 Alzheimer's disease, unspecified: Secondary | ICD-10-CM | POA: Diagnosis not present

## 2017-08-13 DIAGNOSIS — L603 Nail dystrophy: Secondary | ICD-10-CM | POA: Diagnosis not present

## 2017-08-13 DIAGNOSIS — B351 Tinea unguium: Secondary | ICD-10-CM | POA: Diagnosis not present

## 2017-08-13 DIAGNOSIS — M2010 Hallux valgus (acquired), unspecified foot: Secondary | ICD-10-CM | POA: Diagnosis not present

## 2017-08-13 DIAGNOSIS — Q845 Enlarged and hypertrophic nails: Secondary | ICD-10-CM | POA: Diagnosis not present

## 2017-08-14 DIAGNOSIS — H04129 Dry eye syndrome of unspecified lacrimal gland: Secondary | ICD-10-CM | POA: Diagnosis not present

## 2017-08-14 DIAGNOSIS — R1310 Dysphagia, unspecified: Secondary | ICD-10-CM | POA: Diagnosis not present

## 2017-08-14 DIAGNOSIS — I679 Cerebrovascular disease, unspecified: Secondary | ICD-10-CM | POA: Diagnosis not present

## 2017-08-14 DIAGNOSIS — I672 Cerebral atherosclerosis: Secondary | ICD-10-CM | POA: Diagnosis not present

## 2017-08-14 DIAGNOSIS — F339 Major depressive disorder, recurrent, unspecified: Secondary | ICD-10-CM | POA: Diagnosis not present

## 2017-08-14 DIAGNOSIS — G309 Alzheimer's disease, unspecified: Secondary | ICD-10-CM | POA: Diagnosis not present

## 2017-08-15 DIAGNOSIS — I672 Cerebral atherosclerosis: Secondary | ICD-10-CM | POA: Diagnosis not present

## 2017-08-15 DIAGNOSIS — R1310 Dysphagia, unspecified: Secondary | ICD-10-CM | POA: Diagnosis not present

## 2017-08-15 DIAGNOSIS — F339 Major depressive disorder, recurrent, unspecified: Secondary | ICD-10-CM | POA: Diagnosis not present

## 2017-08-15 DIAGNOSIS — H04129 Dry eye syndrome of unspecified lacrimal gland: Secondary | ICD-10-CM | POA: Diagnosis not present

## 2017-08-15 DIAGNOSIS — G309 Alzheimer's disease, unspecified: Secondary | ICD-10-CM | POA: Diagnosis not present

## 2017-08-15 DIAGNOSIS — I679 Cerebrovascular disease, unspecified: Secondary | ICD-10-CM | POA: Diagnosis not present

## 2017-08-19 DIAGNOSIS — F339 Major depressive disorder, recurrent, unspecified: Secondary | ICD-10-CM | POA: Diagnosis not present

## 2017-08-19 DIAGNOSIS — I672 Cerebral atherosclerosis: Secondary | ICD-10-CM | POA: Diagnosis not present

## 2017-08-19 DIAGNOSIS — H04129 Dry eye syndrome of unspecified lacrimal gland: Secondary | ICD-10-CM | POA: Diagnosis not present

## 2017-08-19 DIAGNOSIS — G309 Alzheimer's disease, unspecified: Secondary | ICD-10-CM | POA: Diagnosis not present

## 2017-08-19 DIAGNOSIS — I679 Cerebrovascular disease, unspecified: Secondary | ICD-10-CM | POA: Diagnosis not present

## 2017-08-19 DIAGNOSIS — R1310 Dysphagia, unspecified: Secondary | ICD-10-CM | POA: Diagnosis not present

## 2017-08-21 DIAGNOSIS — R32 Unspecified urinary incontinence: Secondary | ICD-10-CM | POA: Diagnosis not present

## 2017-08-21 DIAGNOSIS — H9190 Unspecified hearing loss, unspecified ear: Secondary | ICD-10-CM | POA: Diagnosis not present

## 2017-08-21 DIAGNOSIS — G309 Alzheimer's disease, unspecified: Secondary | ICD-10-CM | POA: Diagnosis not present

## 2017-08-21 DIAGNOSIS — R1310 Dysphagia, unspecified: Secondary | ICD-10-CM | POA: Diagnosis not present

## 2017-08-21 DIAGNOSIS — F0390 Unspecified dementia without behavioral disturbance: Secondary | ICD-10-CM | POA: Diagnosis not present

## 2017-08-21 DIAGNOSIS — G4700 Insomnia, unspecified: Secondary | ICD-10-CM | POA: Diagnosis not present

## 2017-08-21 DIAGNOSIS — K5900 Constipation, unspecified: Secondary | ICD-10-CM | POA: Diagnosis not present

## 2017-08-21 DIAGNOSIS — I10 Essential (primary) hypertension: Secondary | ICD-10-CM | POA: Diagnosis not present

## 2017-08-21 DIAGNOSIS — H04123 Dry eye syndrome of bilateral lacrimal glands: Secondary | ICD-10-CM | POA: Diagnosis not present

## 2017-08-21 DIAGNOSIS — M8580 Other specified disorders of bone density and structure, unspecified site: Secondary | ICD-10-CM | POA: Diagnosis not present

## 2017-08-21 DIAGNOSIS — F339 Major depressive disorder, recurrent, unspecified: Secondary | ICD-10-CM | POA: Diagnosis not present

## 2017-08-21 DIAGNOSIS — H04129 Dry eye syndrome of unspecified lacrimal gland: Secondary | ICD-10-CM | POA: Diagnosis not present

## 2017-08-21 DIAGNOSIS — I679 Cerebrovascular disease, unspecified: Secondary | ICD-10-CM | POA: Diagnosis not present

## 2017-08-21 DIAGNOSIS — I672 Cerebral atherosclerosis: Secondary | ICD-10-CM | POA: Diagnosis not present

## 2017-08-26 DIAGNOSIS — R1310 Dysphagia, unspecified: Secondary | ICD-10-CM | POA: Diagnosis not present

## 2017-08-26 DIAGNOSIS — I679 Cerebrovascular disease, unspecified: Secondary | ICD-10-CM | POA: Diagnosis not present

## 2017-08-26 DIAGNOSIS — F339 Major depressive disorder, recurrent, unspecified: Secondary | ICD-10-CM | POA: Diagnosis not present

## 2017-08-26 DIAGNOSIS — H04129 Dry eye syndrome of unspecified lacrimal gland: Secondary | ICD-10-CM | POA: Diagnosis not present

## 2017-08-26 DIAGNOSIS — G309 Alzheimer's disease, unspecified: Secondary | ICD-10-CM | POA: Diagnosis not present

## 2017-08-26 DIAGNOSIS — I672 Cerebral atherosclerosis: Secondary | ICD-10-CM | POA: Diagnosis not present

## 2017-08-28 DIAGNOSIS — G309 Alzheimer's disease, unspecified: Secondary | ICD-10-CM | POA: Diagnosis not present

## 2017-08-28 DIAGNOSIS — H04129 Dry eye syndrome of unspecified lacrimal gland: Secondary | ICD-10-CM | POA: Diagnosis not present

## 2017-08-28 DIAGNOSIS — R1310 Dysphagia, unspecified: Secondary | ICD-10-CM | POA: Diagnosis not present

## 2017-08-28 DIAGNOSIS — F339 Major depressive disorder, recurrent, unspecified: Secondary | ICD-10-CM | POA: Diagnosis not present

## 2017-08-28 DIAGNOSIS — I672 Cerebral atherosclerosis: Secondary | ICD-10-CM | POA: Diagnosis not present

## 2017-08-28 DIAGNOSIS — I679 Cerebrovascular disease, unspecified: Secondary | ICD-10-CM | POA: Diagnosis not present

## 2017-08-30 DIAGNOSIS — F339 Major depressive disorder, recurrent, unspecified: Secondary | ICD-10-CM | POA: Diagnosis not present

## 2017-08-30 DIAGNOSIS — H04129 Dry eye syndrome of unspecified lacrimal gland: Secondary | ICD-10-CM | POA: Diagnosis not present

## 2017-08-30 DIAGNOSIS — R1310 Dysphagia, unspecified: Secondary | ICD-10-CM | POA: Diagnosis not present

## 2017-08-30 DIAGNOSIS — I672 Cerebral atherosclerosis: Secondary | ICD-10-CM | POA: Diagnosis not present

## 2017-08-30 DIAGNOSIS — I679 Cerebrovascular disease, unspecified: Secondary | ICD-10-CM | POA: Diagnosis not present

## 2017-08-30 DIAGNOSIS — G309 Alzheimer's disease, unspecified: Secondary | ICD-10-CM | POA: Diagnosis not present

## 2017-09-02 DIAGNOSIS — G309 Alzheimer's disease, unspecified: Secondary | ICD-10-CM | POA: Diagnosis not present

## 2017-09-02 DIAGNOSIS — I672 Cerebral atherosclerosis: Secondary | ICD-10-CM | POA: Diagnosis not present

## 2017-09-02 DIAGNOSIS — R1310 Dysphagia, unspecified: Secondary | ICD-10-CM | POA: Diagnosis not present

## 2017-09-02 DIAGNOSIS — F339 Major depressive disorder, recurrent, unspecified: Secondary | ICD-10-CM | POA: Diagnosis not present

## 2017-09-02 DIAGNOSIS — I679 Cerebrovascular disease, unspecified: Secondary | ICD-10-CM | POA: Diagnosis not present

## 2017-09-02 DIAGNOSIS — H04129 Dry eye syndrome of unspecified lacrimal gland: Secondary | ICD-10-CM | POA: Diagnosis not present

## 2017-09-04 DIAGNOSIS — R1310 Dysphagia, unspecified: Secondary | ICD-10-CM | POA: Diagnosis not present

## 2017-09-04 DIAGNOSIS — I672 Cerebral atherosclerosis: Secondary | ICD-10-CM | POA: Diagnosis not present

## 2017-09-04 DIAGNOSIS — G309 Alzheimer's disease, unspecified: Secondary | ICD-10-CM | POA: Diagnosis not present

## 2017-09-04 DIAGNOSIS — H04129 Dry eye syndrome of unspecified lacrimal gland: Secondary | ICD-10-CM | POA: Diagnosis not present

## 2017-09-04 DIAGNOSIS — I679 Cerebrovascular disease, unspecified: Secondary | ICD-10-CM | POA: Diagnosis not present

## 2017-09-04 DIAGNOSIS — F339 Major depressive disorder, recurrent, unspecified: Secondary | ICD-10-CM | POA: Diagnosis not present

## 2017-09-06 DIAGNOSIS — G309 Alzheimer's disease, unspecified: Secondary | ICD-10-CM | POA: Diagnosis not present

## 2017-09-06 DIAGNOSIS — I672 Cerebral atherosclerosis: Secondary | ICD-10-CM | POA: Diagnosis not present

## 2017-09-06 DIAGNOSIS — R1310 Dysphagia, unspecified: Secondary | ICD-10-CM | POA: Diagnosis not present

## 2017-09-06 DIAGNOSIS — H04129 Dry eye syndrome of unspecified lacrimal gland: Secondary | ICD-10-CM | POA: Diagnosis not present

## 2017-09-06 DIAGNOSIS — F339 Major depressive disorder, recurrent, unspecified: Secondary | ICD-10-CM | POA: Diagnosis not present

## 2017-09-06 DIAGNOSIS — I679 Cerebrovascular disease, unspecified: Secondary | ICD-10-CM | POA: Diagnosis not present

## 2017-09-09 DIAGNOSIS — R1310 Dysphagia, unspecified: Secondary | ICD-10-CM | POA: Diagnosis not present

## 2017-09-09 DIAGNOSIS — I679 Cerebrovascular disease, unspecified: Secondary | ICD-10-CM | POA: Diagnosis not present

## 2017-09-09 DIAGNOSIS — F339 Major depressive disorder, recurrent, unspecified: Secondary | ICD-10-CM | POA: Diagnosis not present

## 2017-09-09 DIAGNOSIS — H04129 Dry eye syndrome of unspecified lacrimal gland: Secondary | ICD-10-CM | POA: Diagnosis not present

## 2017-09-09 DIAGNOSIS — G309 Alzheimer's disease, unspecified: Secondary | ICD-10-CM | POA: Diagnosis not present

## 2017-09-09 DIAGNOSIS — I672 Cerebral atherosclerosis: Secondary | ICD-10-CM | POA: Diagnosis not present

## 2017-09-10 DIAGNOSIS — R32 Unspecified urinary incontinence: Secondary | ICD-10-CM | POA: Diagnosis not present

## 2017-09-10 DIAGNOSIS — F419 Anxiety disorder, unspecified: Secondary | ICD-10-CM | POA: Diagnosis not present

## 2017-09-10 DIAGNOSIS — K5900 Constipation, unspecified: Secondary | ICD-10-CM | POA: Diagnosis not present

## 2017-09-11 DIAGNOSIS — G309 Alzheimer's disease, unspecified: Secondary | ICD-10-CM | POA: Diagnosis not present

## 2017-09-11 DIAGNOSIS — H04129 Dry eye syndrome of unspecified lacrimal gland: Secondary | ICD-10-CM | POA: Diagnosis not present

## 2017-09-11 DIAGNOSIS — I672 Cerebral atherosclerosis: Secondary | ICD-10-CM | POA: Diagnosis not present

## 2017-09-11 DIAGNOSIS — I679 Cerebrovascular disease, unspecified: Secondary | ICD-10-CM | POA: Diagnosis not present

## 2017-09-11 DIAGNOSIS — R1310 Dysphagia, unspecified: Secondary | ICD-10-CM | POA: Diagnosis not present

## 2017-09-11 DIAGNOSIS — F339 Major depressive disorder, recurrent, unspecified: Secondary | ICD-10-CM | POA: Diagnosis not present

## 2017-09-13 DIAGNOSIS — F339 Major depressive disorder, recurrent, unspecified: Secondary | ICD-10-CM | POA: Diagnosis not present

## 2017-09-13 DIAGNOSIS — R1310 Dysphagia, unspecified: Secondary | ICD-10-CM | POA: Diagnosis not present

## 2017-09-13 DIAGNOSIS — I672 Cerebral atherosclerosis: Secondary | ICD-10-CM | POA: Diagnosis not present

## 2017-09-13 DIAGNOSIS — G309 Alzheimer's disease, unspecified: Secondary | ICD-10-CM | POA: Diagnosis not present

## 2017-09-13 DIAGNOSIS — I679 Cerebrovascular disease, unspecified: Secondary | ICD-10-CM | POA: Diagnosis not present

## 2017-09-13 DIAGNOSIS — H04129 Dry eye syndrome of unspecified lacrimal gland: Secondary | ICD-10-CM | POA: Diagnosis not present

## 2017-09-14 DIAGNOSIS — G309 Alzheimer's disease, unspecified: Secondary | ICD-10-CM | POA: Diagnosis not present

## 2017-09-14 DIAGNOSIS — F339 Major depressive disorder, recurrent, unspecified: Secondary | ICD-10-CM | POA: Diagnosis not present

## 2017-09-14 DIAGNOSIS — I679 Cerebrovascular disease, unspecified: Secondary | ICD-10-CM | POA: Diagnosis not present

## 2017-09-14 DIAGNOSIS — R1310 Dysphagia, unspecified: Secondary | ICD-10-CM | POA: Diagnosis not present

## 2017-09-14 DIAGNOSIS — I672 Cerebral atherosclerosis: Secondary | ICD-10-CM | POA: Diagnosis not present

## 2017-09-14 DIAGNOSIS — H04129 Dry eye syndrome of unspecified lacrimal gland: Secondary | ICD-10-CM | POA: Diagnosis not present

## 2017-09-18 DIAGNOSIS — R32 Unspecified urinary incontinence: Secondary | ICD-10-CM | POA: Diagnosis not present

## 2017-09-18 DIAGNOSIS — R634 Abnormal weight loss: Secondary | ICD-10-CM | POA: Diagnosis not present

## 2017-09-18 DIAGNOSIS — I672 Cerebral atherosclerosis: Secondary | ICD-10-CM | POA: Diagnosis not present

## 2017-09-18 DIAGNOSIS — I10 Essential (primary) hypertension: Secondary | ICD-10-CM | POA: Diagnosis not present

## 2017-09-18 DIAGNOSIS — I679 Cerebrovascular disease, unspecified: Secondary | ICD-10-CM | POA: Diagnosis not present

## 2017-09-18 DIAGNOSIS — H04129 Dry eye syndrome of unspecified lacrimal gland: Secondary | ICD-10-CM | POA: Diagnosis not present

## 2017-09-18 DIAGNOSIS — F0390 Unspecified dementia without behavioral disturbance: Secondary | ICD-10-CM | POA: Diagnosis not present

## 2017-09-18 DIAGNOSIS — K5900 Constipation, unspecified: Secondary | ICD-10-CM | POA: Diagnosis not present

## 2017-09-18 DIAGNOSIS — G4700 Insomnia, unspecified: Secondary | ICD-10-CM | POA: Diagnosis not present

## 2017-09-18 DIAGNOSIS — H9190 Unspecified hearing loss, unspecified ear: Secondary | ICD-10-CM | POA: Diagnosis not present

## 2017-09-18 DIAGNOSIS — R1310 Dysphagia, unspecified: Secondary | ICD-10-CM | POA: Diagnosis not present

## 2017-09-18 DIAGNOSIS — F339 Major depressive disorder, recurrent, unspecified: Secondary | ICD-10-CM | POA: Diagnosis not present

## 2017-09-18 DIAGNOSIS — H04123 Dry eye syndrome of bilateral lacrimal glands: Secondary | ICD-10-CM | POA: Diagnosis not present

## 2017-09-18 DIAGNOSIS — M8580 Other specified disorders of bone density and structure, unspecified site: Secondary | ICD-10-CM | POA: Diagnosis not present

## 2017-09-18 DIAGNOSIS — G309 Alzheimer's disease, unspecified: Secondary | ICD-10-CM | POA: Diagnosis not present

## 2017-09-20 DIAGNOSIS — G309 Alzheimer's disease, unspecified: Secondary | ICD-10-CM | POA: Diagnosis not present

## 2017-09-20 DIAGNOSIS — H04129 Dry eye syndrome of unspecified lacrimal gland: Secondary | ICD-10-CM | POA: Diagnosis not present

## 2017-09-20 DIAGNOSIS — I672 Cerebral atherosclerosis: Secondary | ICD-10-CM | POA: Diagnosis not present

## 2017-09-20 DIAGNOSIS — R1310 Dysphagia, unspecified: Secondary | ICD-10-CM | POA: Diagnosis not present

## 2017-09-20 DIAGNOSIS — I679 Cerebrovascular disease, unspecified: Secondary | ICD-10-CM | POA: Diagnosis not present

## 2017-09-20 DIAGNOSIS — F339 Major depressive disorder, recurrent, unspecified: Secondary | ICD-10-CM | POA: Diagnosis not present

## 2017-09-25 DIAGNOSIS — H04129 Dry eye syndrome of unspecified lacrimal gland: Secondary | ICD-10-CM | POA: Diagnosis not present

## 2017-09-25 DIAGNOSIS — I679 Cerebrovascular disease, unspecified: Secondary | ICD-10-CM | POA: Diagnosis not present

## 2017-09-25 DIAGNOSIS — F339 Major depressive disorder, recurrent, unspecified: Secondary | ICD-10-CM | POA: Diagnosis not present

## 2017-09-25 DIAGNOSIS — G309 Alzheimer's disease, unspecified: Secondary | ICD-10-CM | POA: Diagnosis not present

## 2017-09-25 DIAGNOSIS — I672 Cerebral atherosclerosis: Secondary | ICD-10-CM | POA: Diagnosis not present

## 2017-09-25 DIAGNOSIS — R1310 Dysphagia, unspecified: Secondary | ICD-10-CM | POA: Diagnosis not present

## 2017-09-27 DIAGNOSIS — I679 Cerebrovascular disease, unspecified: Secondary | ICD-10-CM | POA: Diagnosis not present

## 2017-09-27 DIAGNOSIS — I672 Cerebral atherosclerosis: Secondary | ICD-10-CM | POA: Diagnosis not present

## 2017-09-27 DIAGNOSIS — F339 Major depressive disorder, recurrent, unspecified: Secondary | ICD-10-CM | POA: Diagnosis not present

## 2017-09-27 DIAGNOSIS — H04129 Dry eye syndrome of unspecified lacrimal gland: Secondary | ICD-10-CM | POA: Diagnosis not present

## 2017-09-27 DIAGNOSIS — R1310 Dysphagia, unspecified: Secondary | ICD-10-CM | POA: Diagnosis not present

## 2017-09-27 DIAGNOSIS — G309 Alzheimer's disease, unspecified: Secondary | ICD-10-CM | POA: Diagnosis not present

## 2017-10-01 IMAGING — CT CT CERVICAL SPINE W/O CM
4 of 8 series · 14 of 33 positions shown, 15 images · non-contrast
Comparison: 04/12/2016

CLINICAL DATA: Witnessed fall from a standing position.

EXAM:
CT HEAD WITHOUT CONTRAST
CT CERVICAL SPINE WITHOUT CONTRAST
TECHNIQUE: Multidetector CT imaging of the head and cervical spine was
performed following the standard protocol without intravenous
contrast. Multiplanar CT image reconstructions of the cervical spine
were also generated.

[Series 6: coronal · coronal · 0.26mm/px · 3 of 63 slices shown]
[im 16/63  bone]
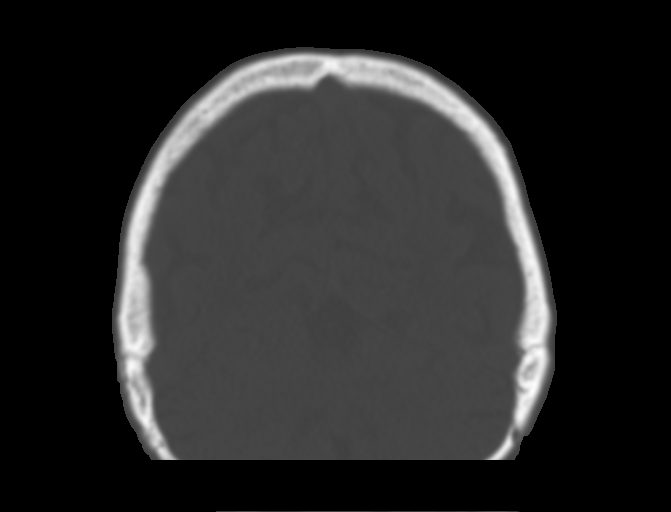
[im 32/63  bone]
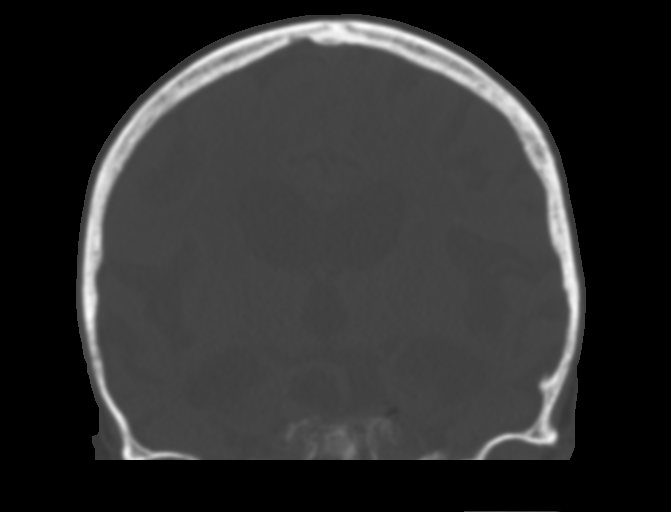
[im 47/63  bone]
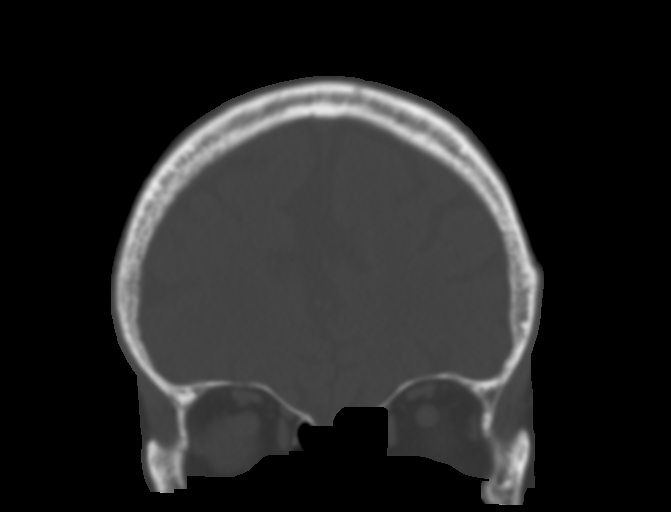

[Series 9: c-spine st · axial · 0.23mm/px · z∈[-261,-179]mm · 3 of 83 slices shown, 4 images]
[im 21/83  soft-tissue]
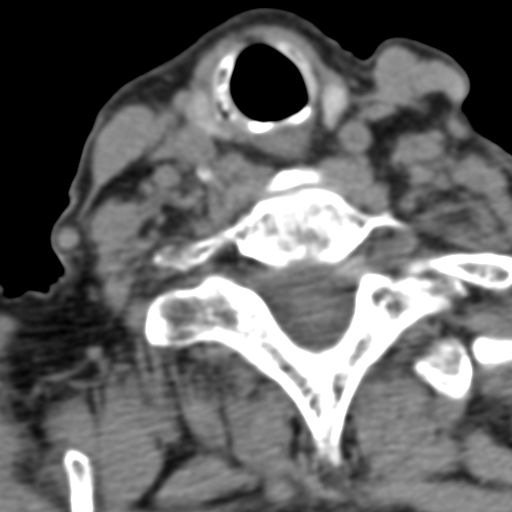
[im 21/83  bone]
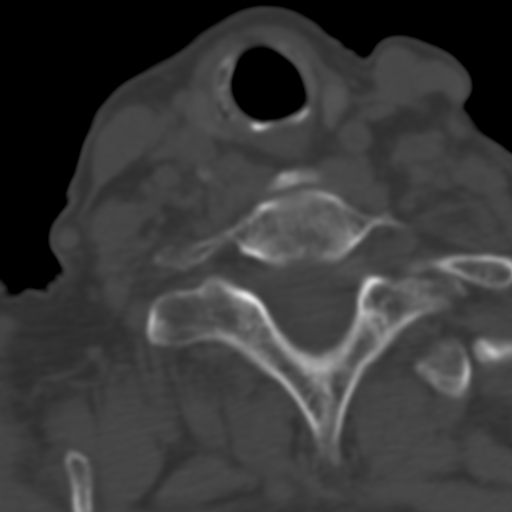
[im 42/83  bone]
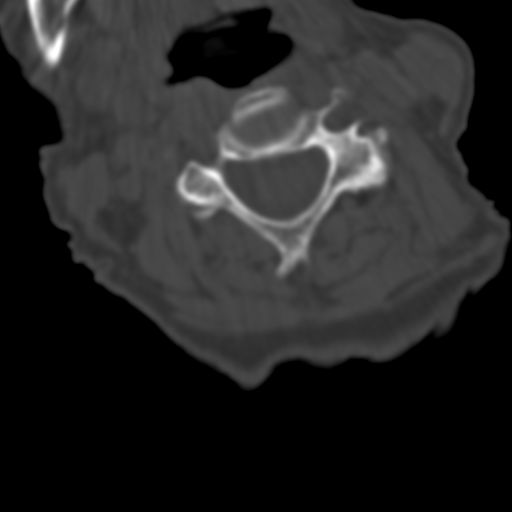
[im 62/83  bone]
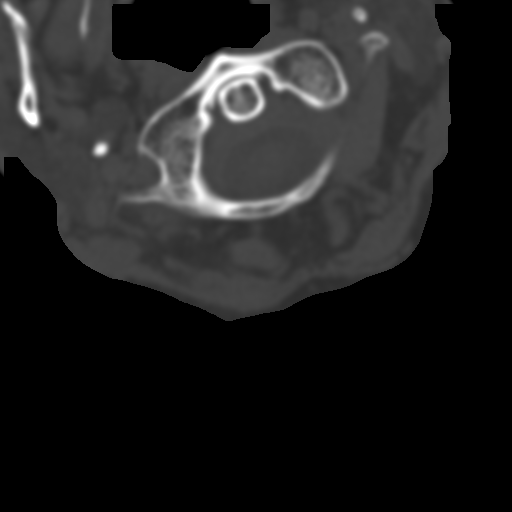

[Series 11: axial recon · axial · 0.23mm/px · z∈[-285,-196]mm · 3 of 92 slices shown]
[im 23/92  bone]
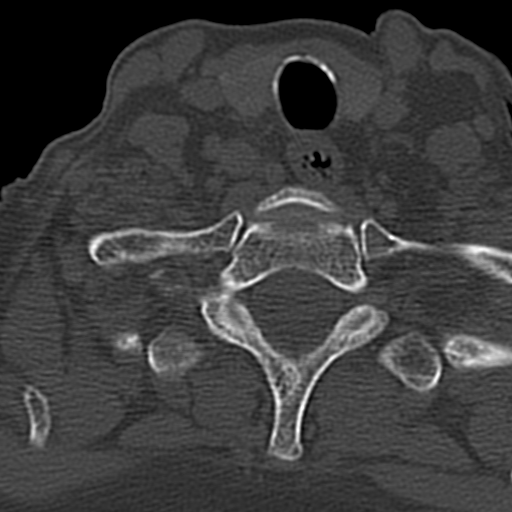
[im 46/92  bone]
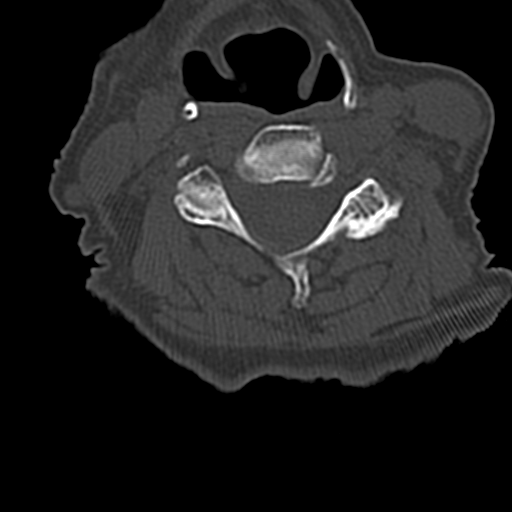
[im 69/92  bone]
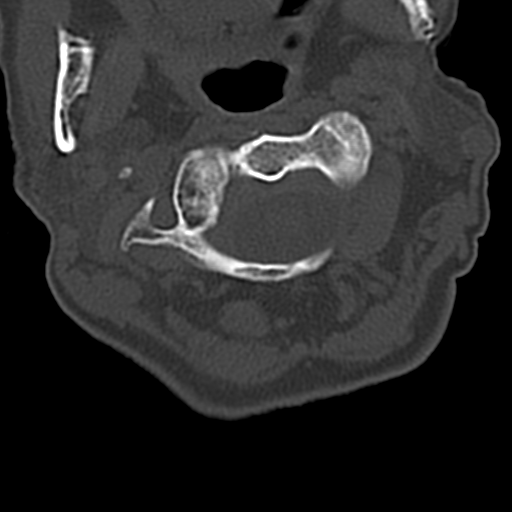

[Series 13: sagittal · sagittal · 0.24mm/px · 5 of 61 slices shown]
[im 11/61  bone]
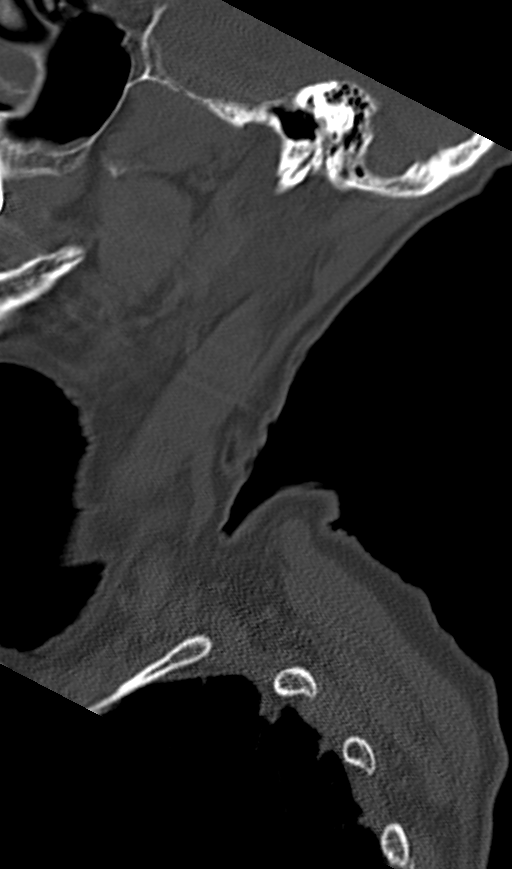
[im 21/61  bone]
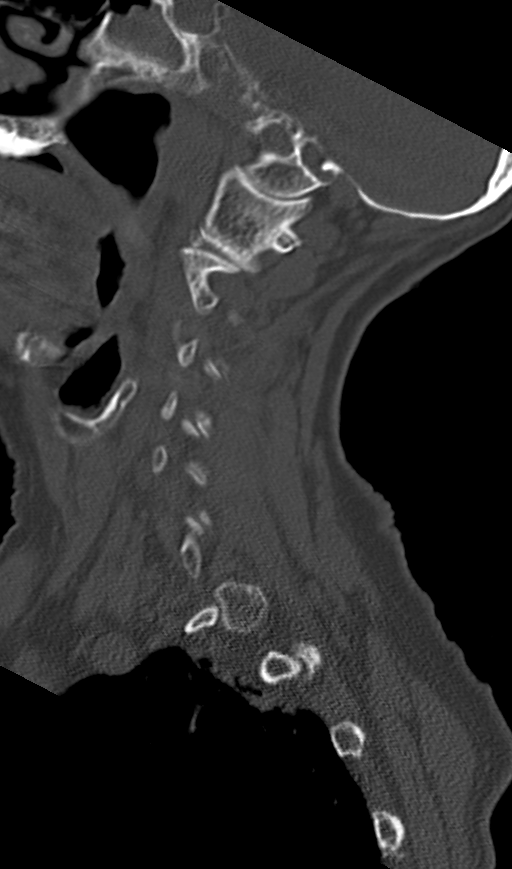
[im 31/61  bone]
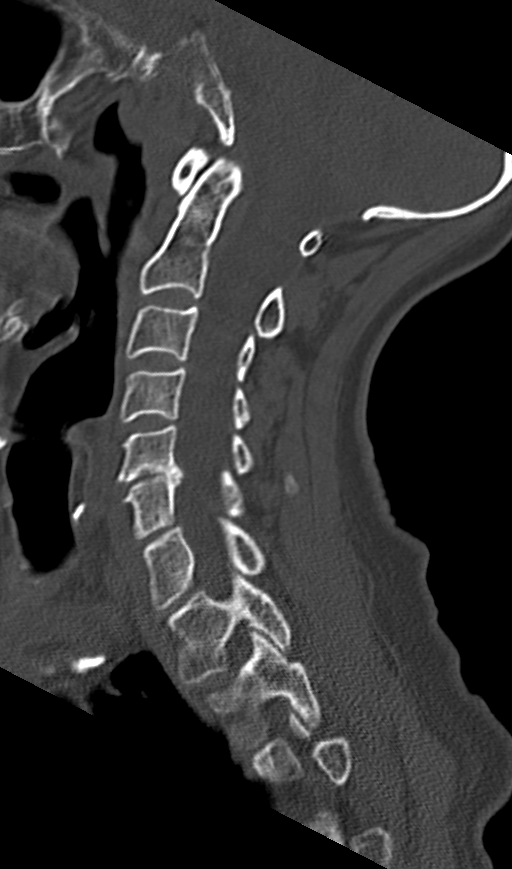
[im 41/61  bone]
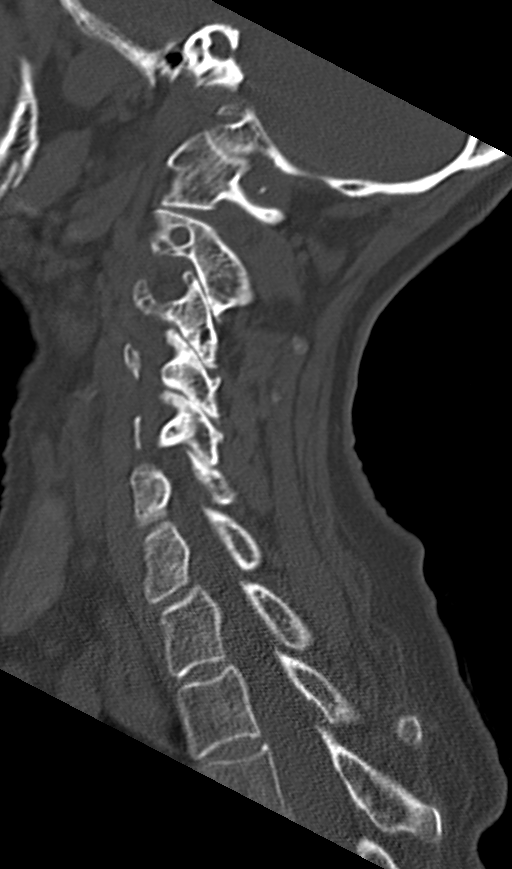
[im 51/61  bone]
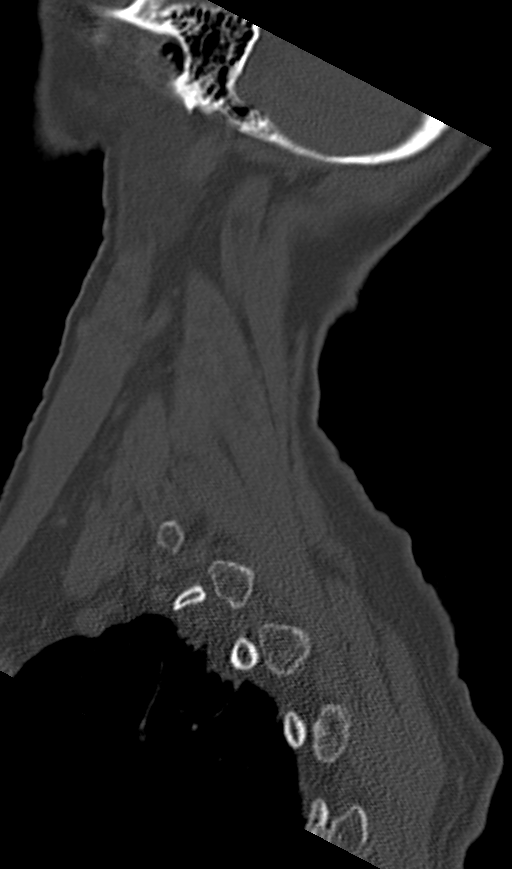

[14 of 33 positions shown; findings below may reference images not displayed]

FINDINGS: CT HEAD FINDINGS

Brain: No acute intracranial hemorrhage, infarction, or intracranial
mass lesion. Diffuse cerebral cortical and cerebellar atrophy, most
prominent in the frontal and temporal lobes, progressed in the
temporal lobes since the prior study. There is also slight
cerebellar atrophy. Chronic periventricular white matter lucency is
stable.

Vascular: No hyperdense vessel or unexpected calcification.

Skull: Normal.

Sinuses/Orbits: Chronic opacification of most of the sphenoid sinus.
Otherwise normal.

Other: None.

CT CERVICAL SPINE FINDINGS

Alignment: Normal.

Skull base and vertebrae: No acute fracture. No primary bone lesion
or focal pathologic process.

Soft tissues and spinal canal: No prevertebral fluid or swelling. No
visible canal hematoma.

Disc levels: There is moderate degenerative disc disease at C5-6
with uncinate spurs asymmetric to the right. There is degenerative
facet arthritis from C2-3 through C5-6 on the left. The appearances
are unchanged since the prior exam.

Upper chest: Slight scarring in the lung apices, stable.

Other: None
IMPRESSION: 1. No acute intracranial abnormality.
2. Progressive temporal lobe atrophy. Chronic small vessel ischemic
changes.
3. No acute abnormality of the cervical spine.

## 2017-10-02 DIAGNOSIS — I672 Cerebral atherosclerosis: Secondary | ICD-10-CM | POA: Diagnosis not present

## 2017-10-02 DIAGNOSIS — G309 Alzheimer's disease, unspecified: Secondary | ICD-10-CM | POA: Diagnosis not present

## 2017-10-02 DIAGNOSIS — H04129 Dry eye syndrome of unspecified lacrimal gland: Secondary | ICD-10-CM | POA: Diagnosis not present

## 2017-10-02 DIAGNOSIS — F339 Major depressive disorder, recurrent, unspecified: Secondary | ICD-10-CM | POA: Diagnosis not present

## 2017-10-02 DIAGNOSIS — R1310 Dysphagia, unspecified: Secondary | ICD-10-CM | POA: Diagnosis not present

## 2017-10-02 DIAGNOSIS — I679 Cerebrovascular disease, unspecified: Secondary | ICD-10-CM | POA: Diagnosis not present

## 2017-10-04 DIAGNOSIS — I672 Cerebral atherosclerosis: Secondary | ICD-10-CM | POA: Diagnosis not present

## 2017-10-04 DIAGNOSIS — F339 Major depressive disorder, recurrent, unspecified: Secondary | ICD-10-CM | POA: Diagnosis not present

## 2017-10-04 DIAGNOSIS — G309 Alzheimer's disease, unspecified: Secondary | ICD-10-CM | POA: Diagnosis not present

## 2017-10-04 DIAGNOSIS — R1310 Dysphagia, unspecified: Secondary | ICD-10-CM | POA: Diagnosis not present

## 2017-10-04 DIAGNOSIS — H04129 Dry eye syndrome of unspecified lacrimal gland: Secondary | ICD-10-CM | POA: Diagnosis not present

## 2017-10-04 DIAGNOSIS — I679 Cerebrovascular disease, unspecified: Secondary | ICD-10-CM | POA: Diagnosis not present

## 2017-10-09 DIAGNOSIS — F419 Anxiety disorder, unspecified: Secondary | ICD-10-CM | POA: Diagnosis not present

## 2017-10-09 DIAGNOSIS — H04129 Dry eye syndrome of unspecified lacrimal gland: Secondary | ICD-10-CM | POA: Diagnosis not present

## 2017-10-09 DIAGNOSIS — I672 Cerebral atherosclerosis: Secondary | ICD-10-CM | POA: Diagnosis not present

## 2017-10-09 DIAGNOSIS — K5900 Constipation, unspecified: Secondary | ICD-10-CM | POA: Diagnosis not present

## 2017-10-09 DIAGNOSIS — G309 Alzheimer's disease, unspecified: Secondary | ICD-10-CM | POA: Diagnosis not present

## 2017-10-09 DIAGNOSIS — R1310 Dysphagia, unspecified: Secondary | ICD-10-CM | POA: Diagnosis not present

## 2017-10-09 DIAGNOSIS — F339 Major depressive disorder, recurrent, unspecified: Secondary | ICD-10-CM | POA: Diagnosis not present

## 2017-10-09 DIAGNOSIS — I679 Cerebrovascular disease, unspecified: Secondary | ICD-10-CM | POA: Diagnosis not present

## 2017-10-09 DIAGNOSIS — R32 Unspecified urinary incontinence: Secondary | ICD-10-CM | POA: Diagnosis not present

## 2017-10-11 DIAGNOSIS — I679 Cerebrovascular disease, unspecified: Secondary | ICD-10-CM | POA: Diagnosis not present

## 2017-10-11 DIAGNOSIS — G309 Alzheimer's disease, unspecified: Secondary | ICD-10-CM | POA: Diagnosis not present

## 2017-10-11 DIAGNOSIS — R1310 Dysphagia, unspecified: Secondary | ICD-10-CM | POA: Diagnosis not present

## 2017-10-11 DIAGNOSIS — F339 Major depressive disorder, recurrent, unspecified: Secondary | ICD-10-CM | POA: Diagnosis not present

## 2017-10-11 DIAGNOSIS — H04129 Dry eye syndrome of unspecified lacrimal gland: Secondary | ICD-10-CM | POA: Diagnosis not present

## 2017-10-11 DIAGNOSIS — I672 Cerebral atherosclerosis: Secondary | ICD-10-CM | POA: Diagnosis not present

## 2018-03-15 DEATH — deceased

## 2018-10-03 NOTE — Telephone Encounter (Signed)
error
